# Patient Record
Sex: Male | Born: 1965 | Race: Black or African American | Hispanic: No | Marital: Single | Smoking: Current every day smoker
Health system: Southern US, Community
[De-identification: ages and names within clinical notes are randomized; demographics above are authoritative.]

## PROBLEM LIST (undated history)

## (undated) DIAGNOSIS — M1990 Unspecified osteoarthritis, unspecified site: Secondary | ICD-10-CM

## (undated) DIAGNOSIS — M797 Fibromyalgia: Secondary | ICD-10-CM

---

## 1997-11-08 ENCOUNTER — Emergency Department (HOSPITAL_COMMUNITY): Admission: EM | Admit: 1997-11-08 | Discharge: 1997-11-08 | Payer: Self-pay | Admitting: Emergency Medicine

## 1997-12-14 ENCOUNTER — Emergency Department (HOSPITAL_COMMUNITY): Admission: EM | Admit: 1997-12-14 | Discharge: 1997-12-14 | Payer: Self-pay | Admitting: Emergency Medicine

## 1999-02-14 ENCOUNTER — Encounter: Payer: Self-pay | Admitting: Emergency Medicine

## 1999-02-14 ENCOUNTER — Emergency Department (HOSPITAL_COMMUNITY): Admission: EM | Admit: 1999-02-14 | Discharge: 1999-02-14 | Payer: Self-pay | Admitting: Emergency Medicine

## 1999-03-03 ENCOUNTER — Encounter: Payer: Self-pay | Admitting: Emergency Medicine

## 1999-03-03 ENCOUNTER — Inpatient Hospital Stay (HOSPITAL_COMMUNITY): Admission: EM | Admit: 1999-03-03 | Discharge: 1999-03-07 | Payer: Self-pay | Admitting: Emergency Medicine

## 1999-06-29 ENCOUNTER — Emergency Department (HOSPITAL_COMMUNITY): Admission: EM | Admit: 1999-06-29 | Discharge: 1999-06-29 | Payer: Self-pay | Admitting: *Deleted

## 1999-08-15 ENCOUNTER — Emergency Department (HOSPITAL_COMMUNITY): Admission: EM | Admit: 1999-08-15 | Discharge: 1999-08-15 | Payer: Self-pay | Admitting: Emergency Medicine

## 1999-12-27 ENCOUNTER — Emergency Department (HOSPITAL_COMMUNITY): Admission: EM | Admit: 1999-12-27 | Discharge: 1999-12-27 | Payer: Self-pay | Admitting: Emergency Medicine

## 2001-04-21 ENCOUNTER — Emergency Department (HOSPITAL_COMMUNITY): Admission: EM | Admit: 2001-04-21 | Discharge: 2001-04-21 | Payer: Self-pay | Admitting: Emergency Medicine

## 2002-02-23 ENCOUNTER — Encounter: Payer: Self-pay | Admitting: Emergency Medicine

## 2002-02-23 ENCOUNTER — Emergency Department (HOSPITAL_COMMUNITY): Admission: EM | Admit: 2002-02-23 | Discharge: 2002-02-23 | Payer: Self-pay | Admitting: Emergency Medicine

## 2004-12-06 ENCOUNTER — Emergency Department (HOSPITAL_COMMUNITY): Admission: EM | Admit: 2004-12-06 | Discharge: 2004-12-06 | Payer: Self-pay | Admitting: Emergency Medicine

## 2005-04-14 ENCOUNTER — Emergency Department (HOSPITAL_COMMUNITY): Admission: EM | Admit: 2005-04-14 | Discharge: 2005-04-14 | Payer: Self-pay | Admitting: Emergency Medicine

## 2006-05-19 ENCOUNTER — Emergency Department (HOSPITAL_COMMUNITY): Admission: EM | Admit: 2006-05-19 | Discharge: 2006-05-19 | Payer: Self-pay | Admitting: Emergency Medicine

## 2007-11-27 ENCOUNTER — Emergency Department (HOSPITAL_COMMUNITY): Admission: EM | Admit: 2007-11-27 | Discharge: 2007-11-27 | Payer: Self-pay | Admitting: Emergency Medicine

## 2008-02-08 ENCOUNTER — Emergency Department (HOSPITAL_COMMUNITY): Admission: EM | Admit: 2008-02-08 | Discharge: 2008-02-08 | Payer: Self-pay | Admitting: Emergency Medicine

## 2010-02-02 ENCOUNTER — Emergency Department (HOSPITAL_COMMUNITY): Admission: EM | Admit: 2010-02-02 | Discharge: 2010-02-02 | Payer: Self-pay | Admitting: Emergency Medicine

## 2010-02-03 ENCOUNTER — Inpatient Hospital Stay (HOSPITAL_COMMUNITY): Admission: RE | Admit: 2010-02-03 | Discharge: 2010-02-08 | Payer: Self-pay | Admitting: Psychiatry

## 2010-02-03 ENCOUNTER — Ambulatory Visit: Payer: Self-pay | Admitting: Psychiatry

## 2010-06-24 ENCOUNTER — Emergency Department (HOSPITAL_COMMUNITY)
Admission: EM | Admit: 2010-06-24 | Discharge: 2010-06-25 | Disposition: A | Discharge status: Home / Self Care | Payer: Self-pay | Attending: Emergency Medicine | Admitting: Emergency Medicine

## 2010-06-24 DIAGNOSIS — F3289 Other specified depressive episodes: Secondary | ICD-10-CM | POA: Insufficient documentation

## 2010-06-24 DIAGNOSIS — F1910 Other psychoactive substance abuse, uncomplicated: Secondary | ICD-10-CM | POA: Insufficient documentation

## 2010-06-24 DIAGNOSIS — F329 Major depressive disorder, single episode, unspecified: Secondary | ICD-10-CM | POA: Insufficient documentation

## 2010-06-24 LAB — CBC
HCT: 41.5 % (ref 39.0–52.0)
Hemoglobin: 14 g/dL (ref 13.0–17.0)
MCH: 31 pg (ref 26.0–34.0)
MCHC: 33.7 g/dL (ref 30.0–36.0)
MCV: 91.8 fL (ref 78.0–100.0)
Platelets: 262 10*3/uL (ref 150–400)
RBC: 4.52 MIL/uL (ref 4.22–5.81)
RDW: 14.6 % (ref 11.5–15.5)
WBC: 8.6 10*3/uL (ref 4.0–10.5)

## 2010-06-24 LAB — DIFFERENTIAL
Basophils Absolute: 0 10*3/uL (ref 0.0–0.1)
Basophils Relative: 0 % (ref 0–1)
Eosinophils Absolute: 0.2 10*3/uL (ref 0.0–0.7)
Eosinophils Relative: 2 % (ref 0–5)
Lymphocytes Relative: 23 % (ref 12–46)
Lymphs Abs: 2 10*3/uL (ref 0.7–4.0)
Monocytes Absolute: 0.4 10*3/uL (ref 0.1–1.0)
Monocytes Relative: 5 % (ref 3–12)
Neutro Abs: 6 10*3/uL (ref 1.7–7.7)
Neutrophils Relative %: 70 % (ref 43–77)

## 2010-06-24 LAB — RAPID URINE DRUG SCREEN, HOSP PERFORMED
Amphetamines: NOT DETECTED
Benzodiazepines: NOT DETECTED

## 2010-06-24 LAB — COMPREHENSIVE METABOLIC PANEL
ALT: 30 U/L (ref 0–53)
AST: 59 U/L — ABNORMAL HIGH (ref 0–37)
CO2: 26 mEq/L (ref 19–32)
Calcium: 9.3 mg/dL (ref 8.4–10.5)
GFR calc Af Amer: >60 mL/min (ref >60)
GFR calc non Af Amer: >60 mL/min (ref >60)
Potassium: 4 mEq/L (ref 3.5–5.1)
Sodium: 139 mEq/L (ref 135–145)

## 2010-06-25 LAB — BASIC METABOLIC PANEL
CO2: 28 mEq/L (ref 19–32)
Chloride: 103 mEq/L (ref 96–112)
GFR calc Af Amer: >60 mL/min (ref >60)
Potassium: 4.2 mEq/L (ref 3.5–5.1)
Sodium: 137 mEq/L (ref 135–145)

## 2010-06-25 LAB — DIFFERENTIAL
Basophils Absolute: 0 10*3/uL (ref 0.0–0.1)
Basophils Relative: 1 % (ref 0–1)
Monocytes Relative: 5 % (ref 3–12)
Neutro Abs: 6.2 10*3/uL (ref 1.7–7.7)
Neutrophils Relative %: 72 % (ref 43–77)

## 2010-06-25 LAB — CBC
Hemoglobin: 13.4 g/dL (ref 13.0–17.0)
RBC: 4.37 MIL/uL (ref 4.22–5.81)

## 2010-06-28 LAB — RAPID URINE DRUG SCREEN, HOSP PERFORMED
Amphetamines: NOT DETECTED
Cocaine: POSITIVE — AB
Opiates: NOT DETECTED
Tetrahydrocannabinol: NOT DETECTED

## 2010-06-28 LAB — DIFFERENTIAL
Basophils Absolute: 0 10*3/uL (ref 0.0–0.1)
Basophils Relative: 0 % (ref 0–1)
Eosinophils Absolute: 0.2 10*3/uL (ref 0.0–0.7)
Monocytes Absolute: 0.3 10*3/uL (ref 0.1–1.0)
Neutro Abs: 6 10*3/uL (ref 1.7–7.7)

## 2010-06-28 LAB — BASIC METABOLIC PANEL
BUN: 9 mg/dL (ref 6–23)
Calcium: 9.3 mg/dL (ref 8.4–10.5)
GFR calc non Af Amer: >60 mL/min (ref >60)
Glucose, Bld: 146 mg/dL — ABNORMAL HIGH (ref 70–99)

## 2010-06-28 LAB — HEPATIC FUNCTION PANEL
AST: 32 U/L (ref 0–37)
Bilirubin, Direct: <0.1 mg/dL (ref 0.0–0.3)

## 2010-06-28 LAB — CBC
HCT: 41.7 % (ref 39.0–52.0)
MCH: 30.4 pg (ref 26.0–34.0)
MCHC: 34.3 g/dL (ref 30.0–36.0)
RDW: 15.3 % (ref 11.5–15.5)

## 2010-06-28 LAB — TSH: TSH: 0.893 u[IU]/mL (ref 0.350–4.500)

## 2010-06-28 LAB — VITAMIN B12: Vitamin B-12: 535 pg/mL (ref 211–911)

## 2010-06-28 LAB — FOLATE RBC: RBC Folate: 543 ng/mL (ref 180–600)

## 2010-12-01 ENCOUNTER — Emergency Department (HOSPITAL_COMMUNITY)
Admission: EM | Admit: 2010-12-01 | Discharge: 2010-12-02 | Disposition: A | Discharge status: Home / Self Care | Payer: Self-pay | Attending: Emergency Medicine | Admitting: Emergency Medicine

## 2010-12-01 DIAGNOSIS — F1010 Alcohol abuse, uncomplicated: Secondary | ICD-10-CM | POA: Insufficient documentation

## 2010-12-01 DIAGNOSIS — F1410 Cocaine abuse, uncomplicated: Secondary | ICD-10-CM | POA: Insufficient documentation

## 2010-12-01 DIAGNOSIS — F29 Unspecified psychosis not due to a substance or known physiological condition: Secondary | ICD-10-CM | POA: Insufficient documentation

## 2010-12-02 LAB — RAPID URINE DRUG SCREEN, HOSP PERFORMED
Barbiturates: NOT DETECTED
Benzodiazepines: NOT DETECTED

## 2010-12-02 LAB — ETHANOL: Alcohol, Ethyl (B): 191 mg/dL — ABNORMAL HIGH (ref 0–11)

## 2011-01-12 LAB — DIFFERENTIAL
Basophils Absolute: 0
Eosinophils Absolute: 0.1
Eosinophils Relative: 2
Monocytes Absolute: 0.2

## 2011-01-12 LAB — CBC
HCT: 38.5 — ABNORMAL LOW
Hemoglobin: 13.4
MCHC: 34.9
MCV: 92.8
Platelets: 235
RDW: 14.1

## 2011-01-12 LAB — RAPID URINE DRUG SCREEN, HOSP PERFORMED
Barbiturates: NOT DETECTED
Benzodiazepines: NOT DETECTED
Cocaine: POSITIVE — AB
Opiates: NOT DETECTED

## 2011-01-12 LAB — POCT I-STAT, CHEM 8
BUN: 12
Calcium, Ion: 1.12
Hemoglobin: 13.6
Sodium: 140
TCO2: 23

## 2011-01-16 LAB — CBC
MCHC: 34.2
MCV: 92.7
Platelets: 257
RDW: 14.2

## 2011-01-16 LAB — BASIC METABOLIC PANEL
CO2: 24
Calcium: 8.8
GFR calc Af Amer: >60
GFR calc non Af Amer: >60
Glucose, Bld: 68 — ABNORMAL LOW
Potassium: 4.2
Sodium: 138

## 2011-01-16 LAB — DIFFERENTIAL
Basophils Absolute: 0
Basophils Relative: 0
Eosinophils Absolute: 0.1
Monocytes Relative: 2 — ABNORMAL LOW
Neutro Abs: 6.7
Neutrophils Relative %: 77

## 2011-01-16 LAB — HEPATIC FUNCTION PANEL
Alkaline Phosphatase: 73
Total Protein: 7.1

## 2011-01-16 LAB — RAPID URINE DRUG SCREEN, HOSP PERFORMED
Amphetamines: NOT DETECTED
Barbiturates: NOT DETECTED
Opiates: NOT DETECTED
Tetrahydrocannabinol: NOT DETECTED

## 2011-01-16 LAB — SALICYLATE LEVEL: Salicylate Lvl: <4

## 2011-01-16 LAB — ACETAMINOPHEN LEVEL: Acetaminophen (Tylenol), Serum: <10 — ABNORMAL LOW

## 2014-11-26 DIAGNOSIS — M1990 Unspecified osteoarthritis, unspecified site: Secondary | ICD-10-CM | POA: Insufficient documentation

## 2014-11-26 DIAGNOSIS — Z791 Long term (current) use of non-steroidal anti-inflammatories (NSAID): Secondary | ICD-10-CM | POA: Insufficient documentation

## 2014-11-26 DIAGNOSIS — F329 Major depressive disorder, single episode, unspecified: Secondary | ICD-10-CM | POA: Insufficient documentation

## 2014-11-26 DIAGNOSIS — F10120 Alcohol abuse with intoxication, uncomplicated: Secondary | ICD-10-CM | POA: Insufficient documentation

## 2014-11-26 DIAGNOSIS — Z720 Tobacco use: Secondary | ICD-10-CM | POA: Insufficient documentation

## 2014-11-27 ENCOUNTER — Encounter (HOSPITAL_COMMUNITY): Payer: Self-pay | Admitting: Emergency Medicine

## 2014-11-27 ENCOUNTER — Emergency Department (HOSPITAL_COMMUNITY)
Admission: EM | Admit: 2014-11-27 | Discharge: 2014-11-27 | Disposition: A | Discharge status: Other Acute Inpatient Hospital | Payer: Self-pay | Attending: Emergency Medicine | Admitting: Emergency Medicine

## 2014-11-27 ENCOUNTER — Encounter (HOSPITAL_COMMUNITY): Payer: Self-pay | Admitting: *Deleted

## 2014-11-27 ENCOUNTER — Inpatient Hospital Stay (HOSPITAL_COMMUNITY)
Admission: AD | Admit: 2014-11-27 | Discharge: 2014-12-05 | DRG: 897 | Disposition: A | Discharge status: Other Acute Inpatient Hospital | Payer: Federal, State, Local not specified - Other | Source: Intra-hospital | Attending: Psychiatry | Admitting: Psychiatry

## 2014-11-27 DIAGNOSIS — F10920 Alcohol use, unspecified with intoxication, uncomplicated: Secondary | ICD-10-CM

## 2014-11-27 DIAGNOSIS — M797 Fibromyalgia: Secondary | ICD-10-CM | POA: Diagnosis present

## 2014-11-27 DIAGNOSIS — F1410 Cocaine abuse, uncomplicated: Secondary | ICD-10-CM | POA: Diagnosis not present

## 2014-11-27 DIAGNOSIS — R292 Abnormal reflex: Secondary | ICD-10-CM | POA: Diagnosis present

## 2014-11-27 DIAGNOSIS — M4712 Other spondylosis with myelopathy, cervical region: Secondary | ICD-10-CM | POA: Diagnosis present

## 2014-11-27 DIAGNOSIS — G4700 Insomnia, unspecified: Secondary | ICD-10-CM | POA: Diagnosis present

## 2014-11-27 DIAGNOSIS — M1990 Unspecified osteoarthritis, unspecified site: Secondary | ICD-10-CM | POA: Diagnosis present

## 2014-11-27 DIAGNOSIS — F1994 Other psychoactive substance use, unspecified with psychoactive substance-induced mood disorder: Secondary | ICD-10-CM | POA: Diagnosis present

## 2014-11-27 DIAGNOSIS — F329 Major depressive disorder, single episode, unspecified: Secondary | ICD-10-CM | POA: Diagnosis present

## 2014-11-27 DIAGNOSIS — F1020 Alcohol dependence, uncomplicated: Secondary | ICD-10-CM | POA: Diagnosis not present

## 2014-11-27 DIAGNOSIS — Z590 Homelessness: Secondary | ICD-10-CM

## 2014-11-27 DIAGNOSIS — M4802 Spinal stenosis, cervical region: Secondary | ICD-10-CM | POA: Diagnosis present

## 2014-11-27 DIAGNOSIS — R2681 Unsteadiness on feet: Secondary | ICD-10-CM

## 2014-11-27 DIAGNOSIS — G9589 Other specified diseases of spinal cord: Secondary | ICD-10-CM | POA: Diagnosis present

## 2014-11-27 DIAGNOSIS — F32A Depression, unspecified: Secondary | ICD-10-CM

## 2014-11-27 DIAGNOSIS — F17210 Nicotine dependence, cigarettes, uncomplicated: Secondary | ICD-10-CM | POA: Diagnosis present

## 2014-11-27 DIAGNOSIS — Z87828 Personal history of other (healed) physical injury and trauma: Secondary | ICD-10-CM

## 2014-11-27 DIAGNOSIS — F1420 Cocaine dependence, uncomplicated: Secondary | ICD-10-CM | POA: Diagnosis not present

## 2014-11-27 HISTORY — DX: Fibromyalgia: M79.7

## 2014-11-27 HISTORY — DX: Unspecified osteoarthritis, unspecified site: M19.90

## 2014-11-27 LAB — RAPID URINE DRUG SCREEN, HOSP PERFORMED
Amphetamines: NOT DETECTED
BENZODIAZEPINES: NOT DETECTED
Barbiturates: NOT DETECTED
COCAINE: NOT DETECTED
OPIATES: NOT DETECTED
TETRAHYDROCANNABINOL: NOT DETECTED

## 2014-11-27 LAB — CBC
HCT: 39.6 % (ref 39.0–52.0)
HEMOGLOBIN: 13.7 g/dL (ref 13.0–17.0)
MCH: 31.4 pg (ref 26.0–34.0)
MCHC: 34.6 g/dL (ref 30.0–36.0)
MCV: 90.8 fL (ref 78.0–100.0)
Platelets: 239 10*3/uL (ref 150–400)
RBC: 4.36 MIL/uL (ref 4.22–5.81)
RDW: 13.3 % (ref 11.5–15.5)
WBC: 9.2 10*3/uL (ref 4.0–10.5)

## 2014-11-27 LAB — COMPREHENSIVE METABOLIC PANEL
ALK PHOS: 56 U/L (ref 38–126)
ALT: 26 U/L (ref 17–63)
AST: 41 U/L (ref 15–41)
Albumin: 4.3 g/dL (ref 3.5–5.0)
Anion gap: 10 (ref 5–15)
BILIRUBIN TOTAL: 0.4 mg/dL (ref 0.3–1.2)
BUN: 6 mg/dL (ref 6–20)
CO2: 25 mmol/L (ref 22–32)
Calcium: 9.2 mg/dL (ref 8.9–10.3)
Chloride: 102 mmol/L (ref 101–111)
Creatinine, Ser: 0.76 mg/dL (ref 0.61–1.24)
GLUCOSE: 88 mg/dL (ref 65–99)
POTASSIUM: 3.7 mmol/L (ref 3.5–5.1)
Sodium: 137 mmol/L (ref 135–145)
Total Protein: 7.8 g/dL (ref 6.5–8.1)

## 2014-11-27 LAB — ETHANOL: Alcohol, Ethyl (B): 131 mg/dL — ABNORMAL HIGH (ref <5)

## 2014-11-27 LAB — ACETAMINOPHEN LEVEL: Acetaminophen (Tylenol), Serum: <10 ug/mL — ABNORMAL LOW (ref 10–30)

## 2014-11-27 LAB — SALICYLATE LEVEL: Salicylate Lvl: <4 mg/dL (ref 2.8–30.0)

## 2014-11-27 MED ORDER — ADULT MULTIVITAMIN W/MINERALS CH: 1.0000 | ORAL_TABLET | Freq: Every day | ORAL | Status: DC

## 2014-11-27 MED ORDER — PANTOPRAZOLE SODIUM 20 MG PO TBEC
20.0000 mg | DELAYED_RELEASE_TABLET | Freq: Every day | ORAL | Status: DC
  Administered 2014-11-27 – 2014-12-05 (×9): 20 mg via ORAL
  Filled 2014-11-27 (×12): qty 1

## 2014-11-27 MED ORDER — LORAZEPAM 1 MG PO TABS
1.0000 mg | ORAL_TABLET | Freq: Four times a day (QID) | ORAL | Status: AC
  Administered 2014-11-27 – 2014-11-28 (×3): 1 mg via ORAL
  Filled 2014-11-27 (×3): qty 1

## 2014-11-27 MED ORDER — HYDROXYZINE HCL 25 MG PO TABS: 25.0000 mg | ORAL_TABLET | Freq: Four times a day (QID) | ORAL | Status: DC | PRN

## 2014-11-27 MED ORDER — THIAMINE HCL 100 MG/ML IJ SOLN
100.0000 mg | Freq: Once | INTRAMUSCULAR | Status: AC
  Administered 2014-11-27: 100 mg via INTRAMUSCULAR
  Filled 2014-11-27: qty 2

## 2014-11-27 MED ORDER — TRAZODONE HCL 50 MG PO TABS
50.0000 mg | ORAL_TABLET | Freq: Every evening | ORAL | Status: DC | PRN
  Filled 2014-11-27 (×3): qty 1

## 2014-11-27 MED ORDER — LORAZEPAM 1 MG PO TABS: 0.0000 mg | ORAL_TABLET | Freq: Two times a day (BID) | ORAL | Status: DC

## 2014-11-27 MED ORDER — LOPERAMIDE HCL 2 MG PO CAPS: 2.0000 mg | ORAL_CAPSULE | ORAL | Status: DC | PRN

## 2014-11-27 MED ORDER — THIAMINE HCL 100 MG/ML IJ SOLN: 100.0000 mg | Freq: Once | INTRAMUSCULAR | Status: DC

## 2014-11-27 MED ORDER — ONDANSETRON 4 MG PO TBDP: 4.0000 mg | ORAL_TABLET | Freq: Four times a day (QID) | ORAL | Status: AC | PRN

## 2014-11-27 MED ORDER — NICOTINE 21 MG/24HR TD PT24: 21.0000 mg | MEDICATED_PATCH | Freq: Every day | TRANSDERMAL | Status: DC

## 2014-11-27 MED ORDER — IBUPROFEN 200 MG PO TABS: 600.0000 mg | ORAL_TABLET | Freq: Three times a day (TID) | ORAL | Status: DC | PRN

## 2014-11-27 MED ORDER — LORAZEPAM 1 MG PO TABS: 0.0000 mg | ORAL_TABLET | Freq: Four times a day (QID) | ORAL | Status: DC

## 2014-11-27 MED ORDER — PNEUMOCOCCAL VAC POLYVALENT 25 MCG/0.5ML IJ INJ: 0.5000 mL | INJECTION | INTRAMUSCULAR | Status: DC

## 2014-11-27 MED ORDER — ADULT MULTIVITAMIN W/MINERALS CH
1.0000 | ORAL_TABLET | Freq: Every day | ORAL | Status: DC
  Administered 2014-11-27 – 2014-12-05 (×9): 1 via ORAL
  Filled 2014-11-27 (×11): qty 1

## 2014-11-27 MED ORDER — NICOTINE POLACRILEX 2 MG MT GUM: 2.0000 mg | CHEWING_GUM | OROMUCOSAL | Status: DC | PRN

## 2014-11-27 MED ORDER — LORAZEPAM 1 MG PO TABS
1.0000 mg | ORAL_TABLET | Freq: Two times a day (BID) | ORAL | Status: AC
  Administered 2014-11-29 – 2014-11-30 (×2): 1 mg via ORAL
  Filled 2014-11-27 (×2): qty 1

## 2014-11-27 MED ORDER — ONDANSETRON 4 MG PO TBDP: 4.0000 mg | ORAL_TABLET | Freq: Four times a day (QID) | ORAL | Status: DC | PRN

## 2014-11-27 MED ORDER — MAGNESIUM HYDROXIDE 400 MG/5ML PO SUSP: 30.0000 mL | Freq: Every day | ORAL | Status: DC | PRN

## 2014-11-27 MED ORDER — LORAZEPAM 1 MG PO TABS
1.0000 mg | ORAL_TABLET | Freq: Every day | ORAL | Status: AC
  Administered 2014-12-01: 1 mg via ORAL
  Filled 2014-11-27: qty 1

## 2014-11-27 MED ORDER — VITAMIN B-1 100 MG PO TABS: 100.0000 mg | ORAL_TABLET | Freq: Every day | ORAL | Status: DC

## 2014-11-27 MED ORDER — ACETAMINOPHEN 325 MG PO TABS: 650.0000 mg | ORAL_TABLET | Freq: Four times a day (QID) | ORAL | Status: DC | PRN

## 2014-11-27 MED ORDER — ALUM & MAG HYDROXIDE-SIMETH 200-200-20 MG/5ML PO SUSP: 30.0000 mL | ORAL | Status: DC | PRN

## 2014-11-27 MED ORDER — LORAZEPAM 1 MG PO TABS
1.0000 mg | ORAL_TABLET | Freq: Three times a day (TID) | ORAL | Status: AC
  Administered 2014-11-28 – 2014-11-29 (×3): 1 mg via ORAL
  Filled 2014-11-27 (×3): qty 1

## 2014-11-27 MED ORDER — FLUOXETINE HCL 20 MG PO CAPS
20.0000 mg | ORAL_CAPSULE | Freq: Every day | ORAL | Status: DC
  Administered 2014-11-27 – 2014-12-03 (×7): 20 mg via ORAL
  Filled 2014-11-27 (×10): qty 1

## 2014-11-27 MED ORDER — LOPERAMIDE HCL 2 MG PO CAPS: 2.0000 mg | ORAL_CAPSULE | ORAL | Status: AC | PRN

## 2014-11-27 MED ORDER — LORAZEPAM 1 MG PO TABS: 1.0000 mg | ORAL_TABLET | Freq: Four times a day (QID) | ORAL | Status: AC | PRN

## 2014-11-27 MED ORDER — MELOXICAM 15 MG PO TABS
15.0000 mg | ORAL_TABLET | Freq: Every day | ORAL | Status: DC
  Administered 2014-11-27 – 2014-12-05 (×9): 15 mg via ORAL
  Filled 2014-11-27 (×11): qty 1

## 2014-11-27 MED ORDER — ENSURE ENLIVE PO LIQD
237.0000 mL | Freq: Two times a day (BID) | ORAL | Status: DC
  Administered 2014-11-27 – 2014-12-05 (×16): 237 mL via ORAL

## 2014-11-27 MED ORDER — HYDROXYZINE HCL 25 MG PO TABS: 25.0000 mg | ORAL_TABLET | Freq: Three times a day (TID) | ORAL | Status: DC | PRN

## 2014-11-27 MED ORDER — VITAMIN B-1 100 MG PO TABS
100.0000 mg | ORAL_TABLET | Freq: Every day | ORAL | Status: DC
  Administered 2014-11-28 – 2014-12-05 (×8): 100 mg via ORAL
  Filled 2014-11-27 (×10): qty 1

## 2014-11-27 MED ORDER — HYDROXYZINE HCL 25 MG PO TABS
25.0000 mg | ORAL_TABLET | Freq: Four times a day (QID) | ORAL | Status: AC | PRN
  Filled 2014-11-27: qty 1

## 2014-11-27 MED ORDER — LORAZEPAM 1 MG PO TABS: 1.0000 mg | ORAL_TABLET | Freq: Four times a day (QID) | ORAL | Status: DC | PRN

## 2014-11-27 MED ORDER — ZOLPIDEM TARTRATE 5 MG PO TABS: 5.0000 mg | ORAL_TABLET | Freq: Every evening | ORAL | Status: DC | PRN

## 2014-11-27 NOTE — ED Notes (Signed)
This RN called [facility] for transport. 

## 2014-11-27 NOTE — Progress Notes (Signed)
Patient ID: Darren Graham, male   DOB: 12/25/1965, 49 y.o.   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  PT consult has been ordered.  Patient may use walker to ambulate per MD.  Patient has been placed on the ativan protocol.  He reports passive SI with no specific plan.  He states he is tired of his drinking and wants help.  He presents with flat, blunted affect with depressed mood.  Patient has been up in the milieu interacting with others. A: Continue to monitor medication management and MD orders.  Safety checks completed every 15 minutes per protocol.  Offer encouragement and support as needed. R: Patient's behavior is appropriate to situation.
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
## 2014-11-27 NOTE — [facility] Group Notes (Signed)
[facility] Group Notes:  (Nursing/MHT/Case Management/Adjunct)  Date:  11/27/2014  Time:  0900 am  Type of Therapy:  Psychoeducational Skills  Participation Level:  Did Not Attend   Patient invited; declined to attend.  [place] Mon 11/27/2014, 10:50 AM

## 2014-11-27 NOTE — ED Notes (Signed)
Ford with BH called to report patient has a bed at [facility]. Bed 307 bed 2. MD Dub Mikes is accepting.

## 2014-11-27 NOTE — H&P (Addendum)
Psychiatric Admission Assessment Adult  Patient Identification: Darren Graham MRN:  [id number] Date of Evaluation:  11/27/2014 Chief Complaint:  " I am drinking myself to death" Principal Diagnosis:Alcohol Dependence , Cocaine Abuse / Substance induced mood disorder Diagnosis:   Patient Active Problem List   Diagnosis Date Noted  . Substance induced mood disorder [F19.94] 11/27/2014  . Alcohol use disorder, severe, dependence [F10.20] 11/27/2014  . Cocaine use disorder, moderate, dependence [F14.20] 11/27/2014   History of Present Illness:: 49 year old male . Long history of alcohol dependence, states he has been drinking up to 400 oz of beer per day. " I am basically drinking all day, from when I get up to when I fall asleep". He has been drinking daily for " at least a few months now".  States " I have been getting sick from drinking, I've lost weight, I need to stop". Due to this he decided to seek treatment , detox.  Last drank 1-2 days ago. He has been using cocaine, but not regularly. At this time he is not presenting with any clear tremors, restlessness or diaphoresis, but states he is feeling " shaky inside ". States he has history of depression, which he attributes to his psychosocial stressors- homelessness, no steady source of income, and to alcohol. Has had some passive SI, with thoughts such as " what's the use of living if I keep on living like this ?" . Denies any current SI and contracts for safety on the unit.  Endorses symptoms of depression as below.   Elements: Severe , Chronic Alcohol Dependence. Also abusing Cocaine. In the context of his substance abuse and severe psychosocial stressors such as homelessness , has been feeling progressively more depressed and has had recent passive SI. Associated Signs/Symptoms: Depression Symptoms:  depressed mood, anhedonia, hopelessness, recurrent thoughts of death, loss of energy/fatigue, weight loss, decreased appetite, (Hypo)  Manic Symptoms: denies  Anxiety Symptoms:  States he is often anxious , denies agoraphobia  Psychotic Symptoms:   Denies  PTSD Symptoms: Denies  Total Time spent with patient: 45 minutes   Past Psychiatric History:  History of depression, has been on Prozac in the past, denies having had side effects. Denies history of mania, denies history of psychosis, denies history of PTSD. States that alcohol contributes to his depression. Denies any history of violence .  Has not been on any psychiatric medications for several years .   Past Medical History: denies medical history other than as below . Past Medical History  Diagnosis Date  . Arthritis   . Fibromyalgia    History reviewed. No pertinent past surgical history. Family History:  Father alive, mother deceased, has two brothers and two sisters.  Denies history of mental illness in family, denies any history of suicides in family. Both parents alcoholic .  Social History:   Single, states he does not  Have children. Homeless, unemployed, no source of income.  Denies legal issues . History  Alcohol Use  . Yes    Comment: 8-10 40 ounces daily     History  Drug Use  . Yes  . Special: Cocaine    Comment: $50-$60 sporadic    Social History   Social History  . Marital Status: Single    Spouse Name: N/A  . Number of Children: N/A  . Years of Education: N/A   Social History Main Topics  . Smoking status: Current Every Day Smoker -- 0.50 packs/day for 35 years    Types: Cigarettes  .  Smokeless tobacco: None  . Alcohol Use: Yes     Comment: 8-10 40 ounces daily  . Drug Use: Yes    Special: Cocaine     Comment: $50-$60 sporadic  . Sexual Activity: Yes    Birth Control/ Protection: Condom   Other Topics Concern  . None   Social History Narrative   Additional Social History:    History of alcohol / drug use?: Yes Longest period of sobriety (when/how long): 2 1/2 years Negative Consequences of Use: Museum/gallery curator, Scientist, research (physical sciences), Personal  relationships, Work / School Withdrawal Symptoms: Nausea / Vomiting, Tremors Name of Substance 1: etoch 1 - Age of First Use: 12 1 - Amount (size/oz): 8-10 40 ounces  1 - Frequency: daily 1 - Duration: 17 years 1 - Last Use / Amount: 11/26/14 Name of Substance 2: cocaine 2 - Age of First Use: 23 2 - Amount (size/oz): $50-$60 2 - Frequency: sporadic 2 - Duration: 26 years 2 - Last Use / Amount: 11/24/14  Musculoskeletal: Strength & Muscle Tone: within normal limits Gait & Station: states he has frequent cramps on R leg which make ambulation difficult at times  Patient leans: N/A  Psychiatric Specialty Exam: Physical Exam  Review of Systems  Constitutional: Positive for weight loss.  HENT: Negative.   Eyes: Negative.   Respiratory: Negative.   Cardiovascular: Negative.   Gastrointestinal: Positive for nausea and vomiting. Negative for blood in stool.       Related to alcohol use    Genitourinary: Negative.   Musculoskeletal: Negative.        States R leg " cramps up " , leading to unsteady gait at times    Skin: Negative.   Neurological: Negative.  Negative for seizures.  Psychiatric/Behavioral: Positive for depression and substance abuse.  all other systems negative   Blood pressure 124/70, pulse 97, temperature 97.6 F (36.4 C), temperature source Oral, resp. rate 18, height 5' 6"  (1.676 m), weight 153 lb (69.4 kg).Body mass index is 24.71 kg/(m^2).  General Appearance: Fairly Groomed  Engineer, water::  Good  Speech:  Normal Rate  Volume:  Normal  Mood:  Depressed  Affect:  Constricted, but reactive   Thought Process:  Linear  Orientation:  Other:  fully alert and attentive   Thought Content:  no hallucinations , no delusions   Suicidal Thoughts:  No  Homicidal Thoughts:  No  Memory:  recent and remote grossly intact   Judgement:  Fair  Insight:  Present  Psychomotor Activity:  Normal- no restlessness, no agitation at this time, no tremors   Concentration:  Good   Recall:  Good  Fund of Knowledge:Good  Language: Good  Akathisia:  Negative  Handed:  Right  AIMS (if indicated):     Assets:  Desire for Improvement Resilience  ADL's:  Fair   Cognition: WNL  Sleep:      Risk to Self: Is patient at risk for suicide?: Yes Risk to Others:   Prior Inpatient Therapy:   Prior Outpatient Therapy:    Alcohol Screening: 1. How often do you have a drink containing alcohol?: 4 or more times a week 2. How many drinks containing alcohol do you have on a typical day when you are drinking?: 7, 8, or 9 3. How often do you have six or more drinks on one occasion?: Daily or almost daily Preliminary Score: 7 4. How often during the last year have you found that you were not able to stop drinking once you had started?:  Daily or almost daily 5. How often during the last year have you failed to do what was normally expected from you becasue of drinking?: Weekly 6. How often during the last year have you needed a first drink in the morning to get yourself going after a heavy drinking session?: Daily or almost daily 7. How often during the last year have you had a feeling of guilt of remorse after drinking?: Daily or almost daily 8. How often during the last year have you been unable to remember what happened the night before because you had been drinking?: Weekly 9. Have you or someone else been injured as a result of your drinking?: Yes, but not in the last year 10. Has a relative or friend or a doctor or another health worker been concerned about your drinking or suggested you cut down?: Yes, during the last year Alcohol Use Disorder Identification Test Final Score (AUDIT): 35  Allergies:  No Known Allergies Lab Results:  Results for orders placed or performed during the hospital encounter of 11/27/14 (from the past 48 hour(s))  Comprehensive metabolic panel     Status: None   Collection Time: 11/27/14  1:04 AM  Result Value Ref Range   Sodium 137 135 - 145 mmol/L    Potassium 3.7 3.5 - 5.1 mmol/L   Chloride 102 101 - 111 mmol/L   CO2 25 22 - 32 mmol/L   Glucose, Bld 88 65 - 99 mg/dL   BUN 6 6 - 20 mg/dL   Creatinine, Ser 0.76 0.61 - 1.24 mg/dL   Calcium 9.2 8.9 - 10.3 mg/dL   Total Protein 7.8 6.5 - 8.1 g/dL   Albumin 4.3 3.5 - 5.0 g/dL   AST 41 15 - 41 U/L   ALT 26 17 - 63 U/L   Alkaline Phosphatase 56 38 - 126 U/L   Total Bilirubin 0.4 0.3 - 1.2 mg/dL   GFR calc non Af Amer >60 >60 mL/min   GFR calc Af Amer >60 >60 mL/min    Comment: (NOTE) The eGFR has been calculated using the CKD EPI equation. This calculation has not been validated in all clinical situations. eGFR's persistently <60 mL/min signify possible Chronic Kidney Disease.    Anion gap 10 5 - 15  Ethanol (ETOH)     Status: Abnormal   Collection Time: 11/27/14  1:04 AM  Result Value Ref Range   Alcohol, Ethyl (B) 131 (H) <5 mg/dL    Comment:        LOWEST DETECTABLE LIMIT FOR SERUM ALCOHOL IS 5 mg/dL FOR MEDICAL PURPOSES ONLY   Urine rapid drug screen (hosp performed) (Not at [facility])     Status: None   Collection Time: 11/27/14  1:04 AM  Result Value Ref Range   Opiates NONE DETECTED NONE DETECTED   Cocaine NONE DETECTED NONE DETECTED   Benzodiazepines NONE DETECTED NONE DETECTED   Amphetamines NONE DETECTED NONE DETECTED   Tetrahydrocannabinol NONE DETECTED NONE DETECTED   Barbiturates NONE DETECTED NONE DETECTED    Comment:        DRUG SCREEN FOR MEDICAL PURPOSES ONLY.  IF CONFIRMATION IS NEEDED FOR ANY PURPOSE, NOTIFY LAB WITHIN 5 DAYS.        LOWEST DETECTABLE LIMITS FOR URINE DRUG SCREEN Drug Class       Cutoff (ng/mL) Amphetamine      1000 Barbiturate      200 Benzodiazepine   915 Tricyclics       056 Opiates  300 Cocaine          300 THC              50   CBC     Status: None   Collection Time: 11/27/14  1:04 AM  Result Value Ref Range   WBC 9.2 4.0 - 10.5 K/uL   RBC 4.36 4.22 - 5.81 MIL/uL   Hemoglobin 13.7 13.0 - 17.0 g/dL   HCT 39.6  39.0 - 52.0 %   MCV 90.8 78.0 - 100.0 fL   MCH 31.4 26.0 - 34.0 pg   MCHC 34.6 30.0 - 36.0 g/dL   RDW 13.3 11.5 - 15.5 %   Platelets 239 706 - 237 K/uL  Salicylate level     Status: None   Collection Time: 11/27/14  1:04 AM  Result Value Ref Range   Salicylate Lvl <6.2 2.8 - 30.0 mg/dL  Acetaminophen level     Status: Abnormal   Collection Time: 11/27/14  1:04 AM  Result Value Ref Range   Acetaminophen (Tylenol), Serum <10 (L) 10 - 30 ug/mL    Comment:        THERAPEUTIC CONCENTRATIONS VARY SIGNIFICANTLY. A RANGE OF 10-30 ug/mL MAY BE AN EFFECTIVE CONCENTRATION FOR MANY PATIENTS. HOWEVER, SOME ARE BEST TREATED AT CONCENTRATIONS OUTSIDE THIS RANGE. ACETAMINOPHEN CONCENTRATIONS >150 ug/mL AT 4 HOURS AFTER INGESTION AND >50 ug/mL AT 12 HOURS AFTER INGESTION ARE OFTEN ASSOCIATED WITH TOXIC REACTIONS.    Current Medications: Current Facility-Administered Medications  Medication Dose Route Frequency Provider Last Rate Last Dose  . acetaminophen (TYLENOL) tablet 650 mg  650 mg Oral Q6H PRN Harriet Butte, NP      . alum & mag hydroxide-simeth (MAALOX/MYLANTA) 200-200-20 MG/5ML suspension 30 mL  30 mL Oral Q4H PRN Harriet Butte, NP      . feeding supplement (ENSURE ENLIVE) (ENSURE ENLIVE) liquid 237 mL  237 mL Oral BID BM Nicholaus Bloom, MD   237 mL at 11/27/14 8315  . hydrOXYzine (ATARAX/VISTARIL) tablet 25 mg  25 mg Oral TID PRN Harriet Butte, NP      . magnesium hydroxide (MILK OF MAGNESIA) suspension 30 mL  30 mL Oral Daily PRN Harriet Butte, NP      . meloxicam (MOBIC) tablet 15 mg  15 mg Oral Daily Harriet Butte, NP   15 mg at 11/27/14 1761  . nicotine polacrilex (NICORETTE) gum 2 mg  2 mg Oral PRN Nicholaus Bloom, MD      . Derrill Memo ON 11/28/2014] pneumococcal 23 valent vaccine (PNU-IMMUNE) injection 0.5 mL  0.5 mL Intramuscular Tomorrow-1000 Nicholaus Bloom, MD      . traZODone (DESYREL) tablet 50 mg  50 mg Oral QHS PRN Harriet Butte, NP       PTA  Medications: Prescriptions prior to admission  Medication Sig Dispense Refill Last Dose  . acetaminophen (TYLENOL) 650 MG CR tablet Take 650 mg by mouth every 8 (eight) hours as needed for pain.   Past Week at Unknown time  . meloxicam (MOBIC) 15 MG tablet Take 15 mg by mouth daily.   Past Month at Unknown time    Previous Psychotropic Medications:  Prozac in the past, but not over several years. States " it worked " and does not remember having had side effects   Substance Abuse History in the last 12 months:  Yes.  - alcohol dependence, as above. Cocaine Abuse. Denies IVDA.    Consequences of Substance Abuse: Blackouts:  history of WDL symptoms, but denies history of WDL seizures   Results for orders placed or performed during the hospital encounter of 11/27/14 (from the past 72 hour(s))  Comprehensive metabolic panel     Status: None   Collection Time: 11/27/14  1:04 AM  Result Value Ref Range   Sodium 137 135 - 145 mmol/L   Potassium 3.7 3.5 - 5.1 mmol/L   Chloride 102 101 - 111 mmol/L   CO2 25 22 - 32 mmol/L   Glucose, Bld 88 65 - 99 mg/dL   BUN 6 6 - 20 mg/dL   Creatinine, Ser 0.76 0.61 - 1.24 mg/dL   Calcium 9.2 8.9 - 10.3 mg/dL   Total Protein 7.8 6.5 - 8.1 g/dL   Albumin 4.3 3.5 - 5.0 g/dL   AST 41 15 - 41 U/L   ALT 26 17 - 63 U/L   Alkaline Phosphatase 56 38 - 126 U/L   Total Bilirubin 0.4 0.3 - 1.2 mg/dL   GFR calc non Af Amer >60 >60 mL/min   GFR calc Af Amer >60 >60 mL/min    Comment: (NOTE) The eGFR has been calculated using the CKD EPI equation. This calculation has not been validated in all clinical situations. eGFR's persistently <60 mL/min signify possible Chronic Kidney Disease.    Anion gap 10 5 - 15  Ethanol (ETOH)     Status: Abnormal   Collection Time: 11/27/14  1:04 AM  Result Value Ref Range   Alcohol, Ethyl (B) 131 (H) <5 mg/dL    Comment:        LOWEST DETECTABLE LIMIT FOR SERUM ALCOHOL IS 5 mg/dL FOR MEDICAL PURPOSES ONLY   Urine rapid  drug screen (hosp performed) (Not at [facility])     Status: None   Collection Time: 11/27/14  1:04 AM  Result Value Ref Range   Opiates NONE DETECTED NONE DETECTED   Cocaine NONE DETECTED NONE DETECTED   Benzodiazepines NONE DETECTED NONE DETECTED   Amphetamines NONE DETECTED NONE DETECTED   Tetrahydrocannabinol NONE DETECTED NONE DETECTED   Barbiturates NONE DETECTED NONE DETECTED    Comment:        DRUG SCREEN FOR MEDICAL PURPOSES ONLY.  IF CONFIRMATION IS NEEDED FOR ANY PURPOSE, NOTIFY LAB WITHIN 5 DAYS.        LOWEST DETECTABLE LIMITS FOR URINE DRUG SCREEN Drug Class       Cutoff (ng/mL) Amphetamine      1000 Barbiturate      200 Benzodiazepine   497 Tricyclics       026 Opiates          300 Cocaine          300 THC              50   CBC     Status: None   Collection Time: 11/27/14  1:04 AM  Result Value Ref Range   WBC 9.2 4.0 - 10.5 K/uL   RBC 4.36 4.22 - 5.81 MIL/uL   Hemoglobin 13.7 13.0 - 17.0 g/dL   HCT 39.6 39.0 - 52.0 %   MCV 90.8 78.0 - 100.0 fL   MCH 31.4 26.0 - 34.0 pg   MCHC 34.6 30.0 - 36.0 g/dL   RDW 13.3 11.5 - 15.5 %   Platelets 239 378 - 588 K/uL  Salicylate level     Status: None   Collection Time: 11/27/14  1:04 AM  Result Value Ref Range   Salicylate Lvl <5.0 2.8 - 30.0  mg/dL  Acetaminophen level     Status: Abnormal   Collection Time: 11/27/14  1:04 AM  Result Value Ref Range   Acetaminophen (Tylenol), Serum <10 (L) 10 - 30 ug/mL    Comment:        THERAPEUTIC CONCENTRATIONS VARY SIGNIFICANTLY. A RANGE OF 10-30 ug/mL MAY BE AN EFFECTIVE CONCENTRATION FOR MANY PATIENTS. HOWEVER, SOME ARE BEST TREATED AT CONCENTRATIONS OUTSIDE THIS RANGE. ACETAMINOPHEN CONCENTRATIONS >150 ug/mL AT 4 HOURS AFTER INGESTION AND >50 ug/mL AT 12 HOURS AFTER INGESTION ARE OFTEN ASSOCIATED WITH TOXIC REACTIONS.     Observation Level/Precautions:  15 minute checks  Laboratory:  As needed   Psychotherapy:  Group, milieu  Medications:  Start Prozac 20 mgrs  QDAY for depression. Ativan Detox Protocol , takes Mobic for arthritic pain, will start Protonix to minimize GERD  symptoms  Consultations:  Consult PT due to R leg cramping leading to unsteady gait at times   Discharge Concerns:  Homelessness , limited social support   Estimated LOS: 5 days   Other:     Psychological Evaluations:  No   Treatment Plan Summary: Daily contact with patient to assess and evaluate symptoms and progress in treatment, Medication management, Plan inpatient admission and medications as above   Medical Decision Making:  Review of Psycho-Social Stressors (1), Review or order clinical lab tests (1), Established Problem, Worsening (2) and Review of Medication Regimen & Side Effects (2)  I certify that inpatient services furnished can reasonably be expected to improve the patient's condition.   COBOS, FERNANDO 8/13/201610:50 AM

## 2014-11-27 NOTE — ED Notes (Addendum)
Pt from home requsting detox from alcohol. Last drink was about 1 hour ago. Pt report SI by drinking self to death. Pt states "I want to stop but if I can't stop what's the use". Denies HI.

## 2014-11-27 NOTE — [facility] Suicide Risk Assessment (Signed)
[facility] Admission Suicide Risk Assessment   Nursing information obtained from:  Patient Demographic factors:   Homeless, unemployed  Current Mental Status:  See below  Loss Factors:  Homelessness, unemployment, limited support system Historical Factors:  Depression, alcohol dependence  Risk Reduction Factors: resilience, desire for improvement  Total Time spent with patient: 45 minutes Principal Problem: Alcohol Dependence  Substance induced mood disorder Diagnosis:   Patient Active Problem List   Diagnosis Date Noted  . Substance induced mood disorder [F19.94] 11/27/2014  . Alcohol use disorder, severe, dependence [F10.20] 11/27/2014  . Cocaine use disorder, moderate, dependence [F14.20] 11/27/2014     Continued Clinical Symptoms:  Alcohol Use Disorder Identification Test Final Score (AUDIT): 35 The "Alcohol Use Disorders Identification Test", Guidelines for Use in Primary Care, Second Edition.  World Science writer [facility]). Score between 0-7:  no or low risk or alcohol related problems. Score between 8-15:  moderate risk of alcohol related problems. Score between 16-19:  high risk of alcohol related problems. Score 20 or above:  warrants further diagnostic evaluation for alcohol dependence and treatment.   CLINICAL FACTORS:  49 year old male , long history of alcohol dependence , has been drinking daily and heavily, presented to ED requesting detox and reporting depression. Currently no SI.    Psychiatric Specialty Exam: Physical Exam  ROS  Blood pressure 124/70, pulse 97, temperature 97.6 F (36.4 C), temperature source Oral, resp. rate 18, height  (1.676 m), weight 153 lb (69.4 kg).Body mass index is 24.71 kg/(m^2).  See Admit Note MSE                                                        COGNITIVE FEATURES THAT CONTRIBUTE TO RISK:  Loss of executive function    SUICIDE RISK:   Moderate:  Frequent suicidal ideation with limited  intensity, and duration, some specificity in terms of plans, no associated intent, good self-control, limited dysphoria/symptomatology, some risk factors present, and identifiable protective factors, including available and accessible social support.  PLAN OF CARE: Patient will be admitted to inpatient psychiatric unit for stabilization and safety. Will provide and encourage milieu participation. Provide medication management and maked adjustments as needed. Medication management to minimize risk of WDL.  Will follow daily.    Medical Decision Making:  New problem, with additional work up planned, Review of Psycho-Social Stressors (1), Established Problem, Worsening (2) and Review of New Medication or Change in Dosage (2)  I certify that inpatient services furnished can reasonably be expected to improve the patient's condition.   Aloni Chuang 11/27/2014, 11:22 AM

## 2014-11-27 NOTE — BH Assessment (Signed)
Received notification of TTS consult request. Spoke to Earley Favor, NP who said Pt wants to stop drinking and says he will drink himself to death. Tele-assessment will be initiated.  Harlin Rain Patsy [place], LPC, [facility], [facility] Triage Specialist [phone]

## 2014-11-27 NOTE — Progress Notes (Signed)
Patient ID: Darren Graham, male   DOB: Dec 12, 1965, 49 y.o.   MRN: [id number] Pt admitted to [facility] requesting detox from ETOH and cocaine. Pt also reported prior to arrival he had self-harm thoughts with no plan. Pt denied previous suicide attempts. Pt reports he has been drinking 8-10 40s daily and using $50-$60 of cocaine sporadically.  Pt is homeless and denies having a support system. Pt denied legal concerns.  This is patient's first admission to [facility]. Fifteen minute checks initiated for patient safety. Pt safe on unit.

## 2014-11-27 NOTE — ED Provider Notes (Signed)
CSN: [id number]     Arrival date & time 11/26/14  2355 History   First MD Initiated Contact with Patient 11/27/14 0102     Chief Complaint  Patient presents with  . Alcohol Problem  . Suicidal     (Consider location/radiation/quality/duration/timing/severity/associated sxs/prior Treatment) HPI Comments: Vision is an alcoholic, who wants to stop.  He also states he's suicidal.  His plan for suicide is to drink himself to death.  This is a passive suicidality  He states he's been to AA and tried numerous therapies on his own without any relief.  Going back to alcohol.  He states that he's had some episodes of vomiting recently  Patient is a 49 y.o. male presenting with alcohol problem. The history is provided by the patient.  Alcohol Problem This is a chronic problem. The problem occurs constantly. The problem has been unchanged. Associated symptoms include vomiting. Pertinent negatives include no abdominal pain, chest pain, coughing or fever. Nothing aggravates the symptoms. He has tried nothing for the symptoms. The treatment provided no relief.    Past Medical History  Diagnosis Date  . Arthritis    History reviewed. No pertinent past surgical history. No family history on file. Social History  Substance Use Topics  . Smoking status: Current Every Day Smoker -- 1.00 packs/day    Types: Cigarettes  . Smokeless tobacco: None  . Alcohol Use: Yes    Review of Systems  Constitutional: Negative for fever.  Respiratory: Negative for cough.   Cardiovascular: Negative for chest pain.  Gastrointestinal: Positive for vomiting. Negative for abdominal pain and abdominal distention.  Musculoskeletal: Negative for back pain.  Psychiatric/Behavioral: Positive for suicidal ideas.      Allergies  Review of patient's allergies indicates no known allergies.  Home Medications   Prior to Admission medications   Medication Sig Start Date End Date Taking? Authorizing Provider   acetaminophen (TYLENOL) 650 MG CR tablet Take 650 mg by mouth every 8 (eight) hours as needed for pain.   Yes Historical Provider, MD  meloxicam (MOBIC) 15 MG tablet Take 15 mg by mouth daily.   Yes Historical Provider, MD   BP 101/50 mmHg  Pulse 78  Temp(Src) 98 F (36.7 C) (Oral)  Resp 16  SpO2 99% Physical Exam  Constitutional: He appears well-developed and well-nourished.  HENT:  Head: Normocephalic.  Eyes: Pupils are equal, round, and reactive to light.  Neck: Normal range of motion.  Cardiovascular: Normal rate.   Pulmonary/Chest: Effort normal.  Musculoskeletal: Normal range of motion.  Neurological: He is alert.  Skin: Skin is warm.  Psychiatric: He has a normal mood and affect. His behavior is normal. Cognition and memory are normal. He expresses inappropriate judgment. He expresses suicidal ideation. He expresses suicidal plans.  Vitals reviewed.   ED Course  Procedures (including critical care time) Labs Review Labs Reviewed  ETHANOL - Abnormal; Notable for the following:    Alcohol, Ethyl (B) 131 (*)    All other components within normal limits  ACETAMINOPHEN LEVEL - Abnormal; Notable for the following:    Acetaminophen (Tylenol), Serum <10 (*)    All other components within normal limits  COMPREHENSIVE METABOLIC PANEL  URINE RAPID DRUG SCREEN, HOSP PERFORMED  CBC  SALICYLATE LEVEL    Imaging Review No results found. I, Eddie Payette K, personally reviewed and evaluated these images and lab results as part of my medical decision-making.   EKG Interpretation None     We'll move to psych unit for further evaluation  CIWA protocol Patient has been accepted at behavioral health Hospital, but a better will not be available until morning MDM   Final diagnoses:  Alcohol intoxication, uncomplicated  Depression        Earley Favor, NP 11/27/14 0112  Earley Favor, NP 11/27/14 9629  Tomasita Crumble, MD 11/27/14 [id number]

## 2014-11-27 NOTE — ED Notes (Signed)
Sprite given per pt request. Pt made aware of bed availibilty at [facility]. This RN awaiting  Discharge/ transfer orders.

## 2014-11-27 NOTE — ED Notes (Addendum)
[facility] transport at bedside. Pt leaving with all belongings he brought.

## 2014-11-27 NOTE — Progress Notes (Signed)
[facility] Group Notes:  (Nursing/MHT/Case Management/Adjunct)  Date:  11/27/2014  Time:  2045  Type of Therapy:  wrap up group  Participation Level:  Active  Participation Quality:  Appropriate, Attentive, Sharing and Supportive  Affect:  Lethargic  Cognitive:  Appropriate  Insight:  Improving  Engagement in Group:  Engaged  Modes of Intervention:  Clarification, Education and Support  Summary of Progress/Problems:  Darren Graham 11/27/2014, 9:58 PM

## 2014-11-27 NOTE — Tx Team (Signed)
Initial Interdisciplinary Treatment Plan   PATIENT STRESSORS: Financial difficulties Substance abuse   PATIENT STRENGTHS: Ability for insight Communication skills General fund of knowledge Motivation for treatment/growth   PROBLEM LIST: Problem List/Patient Goals Date to be addressed Date deferred Reason deferred Estimated date of resolution  Coping skills "learning to cope with my different feelings/behaviors" 11/27/14     depression 11/27/14     Substance abuse "I want to be able to get a handle on it and keep it together" 11/27/14     Suicidal ideation 11/27/14                                    DISCHARGE CRITERIA:  Ability to meet basic life and health needs Adequate post-discharge living arrangements Improved stabilization in mood, thinking, and/or behavior Motivation to continue treatment in a less acute level of care Need for constant or close observation no longer present Reduction of life-threatening or endangering symptoms to within safe limits Verbal commitment to aftercare and medication compliance Withdrawal symptoms are absent or subacute and managed without 24-hour nursing intervention  PRELIMINARY DISCHARGE PLAN: Attend aftercare/continuing care group  PATIENT/FAMIILY INVOLVEMENT: This treatment plan has been presented to and reviewed with the patient, Darren Graham.  The patient and family have been given the opportunity to ask questions and make suggestions.  Darren Graham 11/27/2014, 5:10 AM

## 2014-11-27 NOTE — [facility] Group Notes (Signed)
[facility] Group Notes: (Clinical Social Work)   11/27/2014      Type of Therapy:  Group Therapy   Participation Level:  Did Not Attend despite MHT prompting   Mcclellan Demarais Grossman-Orr, LCSW 11/27/2014, 11:57 AM     

## 2014-11-27 NOTE — ED Notes (Signed)
Report given to [facility] at [facility]

## 2014-11-27 NOTE — Plan of Care (Signed)
Problem: Alteration in mood; excessive anxiety as evidenced by: Goal: STG-Pt will report an absence of self-harm thoughts/actions (Patient will report an absence of self-harm thoughts or actions)  Outcome: Progressing Pt denies SI /HI at this time   Problem: Alteration in mood & ability to function due to Goal: LTG-Pt reports reduction in suicidal thoughts (Patient reports reduction in suicidal thoughts and is able to verbalize a safety plan for whenever patient is feeling suicidal)  Outcome: Progressing Pt denies SI at this time
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
## 2014-11-27 NOTE — ED Notes (Addendum)
Attempted to call report to [facility] and was told they are not accepting patient's until after shift change. Spoke to [facility] and made her aware that [facility]esle[facility] beds are at capacity and patient would have to sleep in triage if not transferred to [facility]. Joann reports to go ahead and send patient over.

## 2014-11-27 NOTE — Progress Notes (Signed)
D: Pt denies SI/HI/AVH. Pt is pleasant and cooperative. Pt appears to want to get help, pt forwards little, but seems to want to get himself right.   A: Pt was offered support and encouragement. Pt was given scheduled medications. Pt was encourage to attend groups. Q 15 minute checks were done for safety.   R:Pt attends groups and interacts well with peers and staff. Pt is taking medication. Pt has no complaints at this time .Pt receptive to treatment and safety maintained on unit.

## 2014-11-28 DIAGNOSIS — F329 Major depressive disorder, single episode, unspecified: Secondary | ICD-10-CM

## 2014-11-28 DIAGNOSIS — G4700 Insomnia, unspecified: Secondary | ICD-10-CM

## 2014-11-28 MED ORDER — METHOCARBAMOL 500 MG PO TABS
ORAL_TABLET | ORAL | Status: AC
Start: 2014-11-28 — End: 2014-11-28
  Administered 2014-11-28: 500 mg via ORAL
  Filled 2014-11-28: qty 1

## 2014-11-28 MED ORDER — GABAPENTIN 100 MG PO CAPS
ORAL_CAPSULE | ORAL | Status: AC
  Administered 2014-11-28: 100 mg via ORAL
  Filled 2014-11-28: qty 1

## 2014-11-28 MED ORDER — METHOCARBAMOL 500 MG PO TABS
500.0000 mg | ORAL_TABLET | Freq: Four times a day (QID) | ORAL | Status: AC
  Administered 2014-11-28 – 2014-12-03 (×18): 500 mg via ORAL
  Filled 2014-11-28 (×21): qty 1

## 2014-11-28 MED ORDER — GABAPENTIN 100 MG PO CAPS
100.0000 mg | ORAL_CAPSULE | Freq: Three times a day (TID) | ORAL | Status: DC
  Administered 2014-11-28 – 2014-11-29 (×2): 100 mg via ORAL
  Filled 2014-11-28 (×5): qty 1

## 2014-11-28 NOTE — Evaluation (Signed)
Physical Therapy One Time Evaluation Patient Details Name: Darren Graham MRN: [id number] DOB: 08-02-1965 Today's Date: 11/28/2014   History of Present Illness  Pt is a 49 year old male admitted to [facility] for detox due to alcohol dependence, also cocaine abuse with hx of fibromyalgia and arthritis.  Clinical Impression  Patient evaluated by Physical Therapy with no further acute PT needs identified. All education has been completed and the patient has no further questions.  See below for any follow-up Physical Therapy or equipment needs. PT is signing off. Thank you for this referral.  Pt presents with neurological symptoms (please refer to Extremity/Trunk Assessment section) and recommended f/u with MD upon d/c from [facility].  Pt reports symptoms intermittently regardless of positioning for the past 3 weeks.  Pt educated that findings today are not consistent with arthritis diagnosis (R hip) as pt was wondering if this was originating from hip.  Pt would benefit from RW upon d/c as well for safety as he reports his symptoms sometimes occur during gait.  Gait pattern also appears rigid and effortful, especially of R LE.     Follow Up Recommendations No PT follow up    Equipment Recommendations  Rolling walker with 5" wheels    Recommendations for Other Services       Precautions / Restrictions Precautions Precautions: Fall      Mobility  Bed Mobility Overal bed mobility: Modified Independent                Transfers Overall transfer level: Modified independent                  Ambulation/Gait Ambulation/Gait assistance: Modified independent (Device/Increase time) Ambulation Distance (Feet): 200 Feet Assistive device: Rolling walker (2 wheeled) Gait Pattern/deviations: Step-through pattern     General Gait Details: rigid appearing effortful gait, especially of R LE, pt ambulating with RW prior to visit so readjusted to pt's height, pt also reports "locking up" of R LE  (similar to spasticity observed with MMT per pt) during gait at times however not observed this visit  Stairs            Wheelchair Mobility    Modified Rankin (Stroke Patients Only)       Balance                                             Pertinent Vitals/Pain Pain Assessment: 0-10 Pain Score: 3  Pain Location: R hip Pain Descriptors / Indicators: Aching Pain Intervention(s): Monitored during session    Home Living Family/patient expects to be discharged to:: Shelter/Homeless                      Prior Function Level of Independence: Independent               Hand Dominance        Extremity/Trunk Assessment   Upper Extremity Assessment: RUE deficits/detail RUE Deficits / Details: reports occasional numbness and tingling of entire R UE, also states at times difficulty with shoulder movement         Lower Extremity Assessment: RLE deficits/detail (MMT 4+/5 throughout LEs) RLE Deficits / Details: spasticity present with resistance during knee extension MMT in sitting (uncontrolled muscle contractions of knee extensors, PF, and foot inversion) approx 5-6 sec duration, able to elicit again, not present with passive motion of  LE       Communication   Communication: No difficulties  Cognition Arousal/Alertness: Awake/alert Behavior During Therapy: WFL for tasks assessed/performed Overall Cognitive Status: Within Functional Limits for tasks assessed                      General Comments      Exercises        Assessment/Plan    PT Assessment Patent does not need any further PT services  PT Diagnosis Difficulty walking   PT Problem List    PT Treatment Interventions     PT Goals (Current goals can be found in the Care Plan section) Acute Rehab PT Goals PT Goal Formulation: All assessment and education complete, DC therapy    Frequency     Barriers to discharge        Co-evaluation                End of Session   Activity Tolerance: Patient tolerated treatment well Patient left: Other (comment) (hallway with RW) Nurse Communication:  (recommending RW for home, f/u with MD, possible neurologic condition)    Functional Assessment Tool Used: clinical judgement Functional Limitation: Mobility: Walking and moving around Mobility: Walking and Moving Around Current Status ([id number]): At least 1 percent but less than 20 percent impaired, limited or restricted Mobility: Walking and Moving Around Goal Status [phone]): At least 1 percent but less than 20 percent impaired, limited or restricted Mobility: Walking and Moving Around Discharge Status [phone]): At least 1 percent but less than 20 percent impaired, limited or restricted    Time: 1051-1108 PT Time Calculation (min) (ACUTE ONLY): 17 min   Charges:   PT Evaluation $Initial PT Evaluation Tier I: 1 Procedure     PT G Codes:   PT G-Codes **NOT FOR INPATIENT CLASS** Functional Assessment Tool Used: clinical judgement Functional Limitation: Mobility: Walking and moving around Mobility: Walking and Moving Around Current Status (B1478): At least 1 percent but less than 20 percent impaired, limited or restricted Mobility: Walking and Moving Around Goal Status [phone]): At least 1 percent but less than 20 percent impaired, limited or restricted Mobility: Walking and Moving Around Discharge Status [phone]): At least 1 percent but less than 20 percent impaired, limited or restricted    Tan Clopper,KATHrine E 11/28/2014, 11:54 AM Zenovia Jarred, PT, DPT 11/28/2014 Pager: [phone]

## 2014-11-28 NOTE — Progress Notes (Signed)
NUTRITION ASSESSMENT  Pt identified as at risk on the Malnutrition Screen Tool  INTERVENTION: 1. Educated patient on the importance of nutrition and encouraged intake of food and beverages. 2. Discussed weight goals. 3. Supplements: Ensure Enlive po BID, each supplement provides 350 kcal and 20 grams of protein  NUTRITION DIAGNOSIS: Unintentional weight loss related to sub-optimal intake as evidenced by pt report.   Goal: Pt to meet >/= 90% of their estimated nutrition needs.  Monitor:  PO intake  Assessment:  Pt admitted with ETOH abuse and cocaine use. Pt is currently homeless.  Pt reports drinking 400 oz of beer a day per H&P. Pt would benefit from nutritional supplementation, RD to order.  No weight history provided by EPIC.  Height: Ht Readings from Last 1 Encounters:  11/27/14  (1.676 m)    Weight: Wt Readings from Last 1 Encounters:  11/27/14 153 lb (69.4 kg)    Weight Hx: Wt Readings from Last 10 Encounters:  11/27/14 153 lb (69.4 kg)    BMI:  Body mass index is 24.71 kg/(m^2). Pt meets criteria for normal range based on current BMI.  Estimated Nutritional Needs: Kcal: 25-30 kcal/kg Protein: > 1 gram protein/kg Fluid: 1 ml/kcal  Diet Order: Diet regular Room service appropriate?: Yes; Fluid consistency:: Thin Pt is also offered choice of unit snacks mid-morning and mid-afternoon.  Pt is eating as desired.   Lab results and medications reviewed.   Tilda Franco, MS, RD, LDN Pager: [phone] After Hours Pager: [phone]

## 2014-11-28 NOTE — Progress Notes (Signed)
Patient did attend the evening speaker AA meeting.  

## 2014-11-28 NOTE — BHH Group Notes (Signed)
[facility] Group Notes:  (Clinical Social Work)  11/28/2014  10:00-11:00AM  Summary of Progress/Problems:   The main focus of today's process group was to   1)  discuss the importance of adding supports  2)  define health supports versus unhealthy supports  3)  identify the patient's current unhealthy supports and plan how to handle them  4)  Identify the patient's current healthy supports and plan what to add.  An emphasis was placed on using counselor, doctor, therapy groups, 12-step groups, and problem-specific support groups to expand supports.    The patient expressed full comprehension of the concepts presented, and agreed that there is a need to add more supports.  He stated he currently has no support in his life at all, except the little self-support he can give on the days he is in a good place. The patient stated he needs help with coming up with ideas for some additional supports, and just when group was starting to address this, Physical Therapy consult staff arrived.  Type of Therapy:  Process Group with Motivational Interviewing  Participation Level:  Active  Participation Quality:  Attentive and Sharing  Affect:  Blunted  Cognitive:  Alert and Appropriate  Insight:  Engaged  Engagement in Therapy:  Engaged  Modes of Intervention:   Education, Support and Processing, Activity  Ambrose Mantle, LCSW 11/28/2014

## 2014-11-28 NOTE — [facility] Suicide Risk Assessment (Signed)
[facility] INPATIENT:  Family/Significant Other Suicide Prevention Education  Suicide Prevention Education:  Patient Refusal for Family/Significant Other Suicide Prevention Education: The patient Darren Graham has refused to provide written consent for family/significant other to be provided Family/Significant Other Suicide Prevention Education during admission and/or prior to discharge.  Physician notified.  Patient stated that he would "love" for this information to be given to his family, but he has been unable to contact them, does not have contact information.  Sarina Ser 11/28/2014, 8:24 AM

## 2014-11-28 NOTE — Progress Notes (Signed)
Patient ID: Darren Graham, male   DOB: 1965-12-17, 49 y.o.   MRN: [id number]   D: Patient continues to have difficulty ambulating due to severe right hip pain, PT did see patient and reported that patient needed a walker to ambulate while at [facility] and when he goes home. PT reported that the patients issues seem to be more neurological and that he needed to be referred for an evaluation. PT reported that they would document all information in their note, Aggie NP was made aware. New orders were noted for medication to help patient with pain. Pt continues to be very flat and depressed, however attended all groups and engaged in treatment. Pt reported being negative SI/HI, no AH/VH noted. A: 15 min checks continued for patient safety. R: Pt safety maintained.

## 2014-11-28 NOTE — Progress Notes (Signed)
[facility] MD Progress Note  11/28/2014 4:34 PM Darren Graham  MRN:  [id number]  Subjective: Darren Graham is here for alcohol use disorder. He is currently receiving alcohol detox treatment and medication for depression. His main complaints today is pain & problem with ambulation. He is seen by the Physical therapist for evaluation. He was recommended to use walker inpatient & after discharge. Robaxin 500 mg & Neurontin 100 mg added to his treatment regimen for pain management. He currently denies any SIHI, AVH.  Principal Problem: Alcohol use disorder, severe, dependence  Diagnosis:   Patient Active Problem List   Diagnosis Date Noted  . Substance induced mood disorder [F19.94] 11/27/2014  . Alcohol use disorder, severe, dependence [F10.20] 11/27/2014  . Cocaine use disorder, moderate, dependence [F14.20] 11/27/2014   Total Time spent with patient: 25 minutes  Past Medical History:  Past Medical History  Diagnosis Date  . Arthritis   . Fibromyalgia    History reviewed. No pertinent past surgical history. Family History: History reviewed. No pertinent family history.  Social History:  History  Alcohol Use  . Yes    Comment: 8-10 40 ounces daily     History  Drug Use  . Yes  . Special: Cocaine    Comment: $50-$60 sporadic    Social History   Social History  . Marital Status: Single    Spouse Name: N/A  . Number of Children: N/A  . Years of Education: N/A   Social History Main Topics  . Smoking status: Current Every Day Smoker -- 0.50 packs/day for 35 years    Types: Cigarettes  . Smokeless tobacco: None  . Alcohol Use: Yes     Comment: 8-10 40 ounces daily  . Drug Use: Yes    Special: Cocaine     Comment: $50-$60 sporadic  . Sexual Activity: Yes    Birth Control/ Protection: Condom   Other Topics Concern  . None   Social History Narrative   Additional History:    Sleep: Fair  Appetite:  Fair  Musculoskeletal: Strength & Muscle Tone: within normal limits Gait &  Station: unsteady, drags both legs when ambulating. Patient leans: N/A  Psychiatric Specialty Exam: Physical Exam  Review of Systems  Constitutional: Positive for chills, malaise/fatigue and diaphoresis.  HENT: Negative.   Eyes: Negative.   Respiratory: Negative.   Cardiovascular: Negative.   Genitourinary: Negative.   Musculoskeletal: Positive for myalgias, back pain, joint pain and falls (At risks for falls).  Skin: Negative.   Neurological: Positive for tremors and weakness.  Endo/Heme/Allergies: Negative.   Psychiatric/Behavioral: Positive for depression. Negative for suicidal ideas, hallucinations and memory loss. Substance abuse: Alcoholism, chronic. The patient is nervous/anxious and has insomnia.     Blood pressure 103/56, pulse 66, temperature 97.8 F (36.6 C), temperature source Oral, resp. rate 24, height _0  (1.676 m), weight 69.4 kg (153 lb).Body mass index is 24.71 kg/(m^2).  General Appearance: Casual  Eye Contact::  Good  Speech:  Clear and Coherent  Volume:  Normal  Mood:  Depressed  Affect:  Flat  Thought Process:  Coherent and Goal Directed  Orientation:  Full (Time, Place, and Person)  Thought Content:  Rumination and denies any hallucinations  Suicidal Thoughts:  No  Homicidal Thoughts:  No  Memory:  Immediate;   Good Recent;   Good Remote;   Good  Judgement:  Fair  Insight:  Fair  Psychomotor Activity:  Tremor and generalized weakness  Concentration:  Fair  Recall:  [employer] of  Knowledge:Fair  Language: Good  Akathisia:  No  Handed:  Right  AIMS (if indicated):     Assets:  Desire for Improvement  ADL's:  Intact  Cognition: WNL  Sleep:  Number of Hours: 6.25   Current Medications: Current Facility-Administered Medications  Medication Dose Route Frequency Provider Last Rate Last Dose  . acetaminophen (TYLENOL) tablet 650 mg  650 mg Oral Q6H PRN Harriet Butte, NP      . alum & mag hydroxide-simeth (MAALOX/MYLANTA) 200-200-20 MG/5ML  suspension 30 mL  30 mL Oral Q4H PRN Harriet Butte, NP      . feeding supplement (ENSURE ENLIVE) (ENSURE ENLIVE) liquid 237 mL  237 mL Oral BID BM Nicholaus Bloom, MD   237 mL at 11/28/14 1152  . FLUoxetine (PROZAC) capsule 20 mg  20 mg Oral Daily Jenne Campus, MD   20 mg at 11/28/14 8676  . hydrOXYzine (ATARAX/VISTARIL) tablet 25 mg  25 mg Oral Q6H PRN Jenne Campus, MD      . loperamide (IMODIUM) capsule 2-4 mg  2-4 mg Oral PRN Jenne Campus, MD      . LORazepam (ATIVAN) tablet 1 mg  1 mg Oral Q6H PRN Myer Peer Cobos, MD      . LORazepam (ATIVAN) tablet 1 mg  1 mg Oral TID Jenne Campus, MD   1 mg at 11/28/14 1152   Followed by  . [START ON 11/29/2014] LORazepam (ATIVAN) tablet 1 mg  1 mg Oral BID Jenne Campus, MD       Followed by  . [START ON 12/01/2014] LORazepam (ATIVAN) tablet 1 mg  1 mg Oral Daily Fernando A Cobos, MD      . magnesium hydroxide (MILK OF MAGNESIA) suspension 30 mL  30 mL Oral Daily PRN Harriet Butte, NP      . meloxicam (MOBIC) tablet 15 mg  15 mg Oral Daily Harriet Butte, NP   15 mg at 11/28/14 0806  . multivitamin with minerals tablet 1 tablet  1 tablet Oral Daily Jenne Campus, MD   1 tablet at 11/28/14 0806  . nicotine polacrilex (NICORETTE) gum 2 mg  2 mg Oral PRN Nicholaus Bloom, MD      . ondansetron (ZOFRAN-ODT) disintegrating tablet 4 mg  4 mg Oral Q6H PRN Jenne Campus, MD      . pantoprazole (PROTONIX) EC tablet 20 mg  20 mg Oral Daily Jenne Campus, MD   20 mg at 11/28/14 0806  . pneumococcal 23 valent vaccine (PNU-IMMUNE) injection 0.5 mL  0.5 mL Intramuscular Tomorrow-1000 Nicholaus Bloom, MD   0.5 mL at 11/28/14 1000  . thiamine (VITAMIN B-1) tablet 100 mg  100 mg Oral Daily Jenne Campus, MD   100 mg at 11/28/14 0806  . traZODone (DESYREL) tablet 50 mg  50 mg Oral QHS PRN Harriet Butte, NP       Lab Results:  Results for orders placed or performed during the hospital encounter of 11/27/14 (from the past 48 hour(s))   Comprehensive metabolic panel     Status: None   Collection Time: 11/27/14  1:04 AM  Result Value Ref Range   Sodium 137 135 - 145 mmol/L   Potassium 3.7 3.5 - 5.1 mmol/L   Chloride 102 101 - 111 mmol/L   CO2 25 22 - 32 mmol/L   Glucose, Bld 88 65 - 99 mg/dL   BUN 6 6 - 20 mg/dL  Creatinine, Ser 0.76 0.61 - 1.24 mg/dL   Calcium 9.2 8.9 - 10.3 mg/dL   Total Protein 7.8 6.5 - 8.1 g/dL   Albumin 4.3 3.5 - 5.0 g/dL   AST 41 15 - 41 U/L   ALT 26 17 - 63 U/L   Alkaline Phosphatase 56 38 - 126 U/L   Total Bilirubin 0.4 0.3 - 1.2 mg/dL   GFR calc non Af Amer >60 >60 mL/min   GFR calc Af Amer >60 >60 mL/min    Comment: (NOTE) The eGFR has been calculated using the CKD EPI equation. This calculation has not been validated in all clinical situations. eGFR's persistently <60 mL/min signify possible Chronic Kidney Disease.    Anion gap 10 5 - 15  Ethanol (ETOH)     Status: Abnormal   Collection Time: 11/27/14  1:04 AM  Result Value Ref Range   Alcohol, Ethyl (B) 131 (H) <5 mg/dL    Comment:        LOWEST DETECTABLE LIMIT FOR SERUM ALCOHOL IS 5 mg/dL FOR MEDICAL PURPOSES ONLY   Urine rapid drug screen (hosp performed) (Not at [facility])     Status: None   Collection Time: 11/27/14  1:04 AM  Result Value Ref Range   Opiates NONE DETECTED NONE DETECTED   Cocaine NONE DETECTED NONE DETECTED   Benzodiazepines NONE DETECTED NONE DETECTED   Amphetamines NONE DETECTED NONE DETECTED   Tetrahydrocannabinol NONE DETECTED NONE DETECTED   Barbiturates NONE DETECTED NONE DETECTED    Comment:        DRUG SCREEN FOR MEDICAL PURPOSES ONLY.  IF CONFIRMATION IS NEEDED FOR ANY PURPOSE, NOTIFY LAB WITHIN 5 DAYS.        LOWEST DETECTABLE LIMITS FOR URINE DRUG SCREEN Drug Class       Cutoff (ng/mL) Amphetamine      1000 Barbiturate      200 Benzodiazepine   882 Tricyclics       800 Opiates          300 Cocaine          300 THC              50   CBC     Status: None   Collection Time: 11/27/14   1:04 AM  Result Value Ref Range   WBC 9.2 4.0 - 10.5 K/uL   RBC 4.36 4.22 - 5.81 MIL/uL   Hemoglobin 13.7 13.0 - 17.0 g/dL   HCT 39.6 39.0 - 52.0 %   MCV 90.8 78.0 - 100.0 fL   MCH 31.4 26.0 - 34.0 pg   MCHC 34.6 30.0 - 36.0 g/dL   RDW 13.3 11.5 - 15.5 %   Platelets 239 349 - 179 K/uL  Salicylate level     Status: None   Collection Time: 11/27/14  1:04 AM  Result Value Ref Range   Salicylate Lvl <1.5 2.8 - 30.0 mg/dL  Acetaminophen level     Status: Abnormal   Collection Time: 11/27/14  1:04 AM  Result Value Ref Range   Acetaminophen (Tylenol), Serum <10 (L) 10 - 30 ug/mL    Comment:        THERAPEUTIC CONCENTRATIONS VARY SIGNIFICANTLY. A RANGE OF 10-30 ug/mL MAY BE AN EFFECTIVE CONCENTRATION FOR MANY PATIENTS. HOWEVER, SOME ARE BEST TREATED AT CONCENTRATIONS OUTSIDE THIS RANGE. ACETAMINOPHEN CONCENTRATIONS >150 ug/mL AT 4 HOURS AFTER INGESTION AND >50 ug/mL AT 12 HOURS AFTER INGESTION ARE OFTEN ASSOCIATED WITH TOXIC REACTIONS.     Physical Findings: AIMS: Facial  and Oral Movements Muscles of Facial Expression: None, normal Lips and Perioral Area: None, normal Jaw: None, normal Tongue: None, normal,Extremity Movements Upper (arms, wrists, hands, fingers): None, normal Lower (legs, knees, ankles, toes): None, normal, Trunk Movements Neck, shoulders, hips: None, normal, Overall Severity Severity of abnormal movements (highest score from questions above): None, normal Incapacitation due to abnormal movements: None, normal Patient's awareness of abnormal movements (rate only patient's report): No Awareness, Dental Status Current problems with teeth and/or dentures?: Yes Does patient usually wear dentures?: No  CIWA:  CIWA-Ar Total: 3 COWS:     Treatment Plan Summary: Daily contact with patient to assess and evaluate symptoms and progress in treatment and Medication management:  Plan: Supportive approach/coping skills/relapse prevention           Depression:  Continue Prozac 20 mg and continue to work with mindfulness, CBT help  identify the cognitive distortions that keep the depression going          Discuss other life style changes that can help with both his depression and his alcohol use such like exercise, meditation           Alcohol Dependence: will continue the Ativan detox protocol, will use motivational interviewing and encourage to pursue total abstinence. Insomnia: Continue Trazodone 50 mg. Problem with mobility: Was evaluated by the Physical therapy today. Was recommended to use walker while inpatient & after discharge. Muscle pain: initiate Robaxin 500 mg qid & Neurontin 100 mg tid.           Medical Decision Making:  Review of Last Therapy Session (1), Review of Medication Regimen & Side Effects (2) and Review of New Medication or Change in Dosage (2)  Encarnacion Slates, PMHNP, FNP-BC 11/28/2014, 4:34 PM Agree with  NP  Progress Note, as above  Neita Garnet, MD

## 2014-11-29 DIAGNOSIS — R2681 Unsteadiness on feet: Secondary | ICD-10-CM

## 2014-11-29 DIAGNOSIS — F1420 Cocaine dependence, uncomplicated: Secondary | ICD-10-CM

## 2014-11-29 MED ORDER — GABAPENTIN 100 MG PO CAPS
200.0000 mg | ORAL_CAPSULE | Freq: Three times a day (TID) | ORAL | Status: DC
  Administered 2014-11-29 – 2014-12-05 (×19): 200 mg via ORAL
  Filled 2014-11-29 (×25): qty 2

## 2014-11-29 NOTE — Progress Notes (Signed)
[facility] MD Progress Note  11/29/2014 11:03 AM Darren Graham  MRN:  [id number] Subjective:  States he wants help for his depression and substance abuse. He also states that he has developed spontaneous muscle contractions in his left leg. He has a history of arthritis in his hip and initially thought it was secondary to it. He has some numbness in that leg. Wants to be able to go to a residential treatment program. He is currently homeless and has been homeless for a while.  Principal Problem: Alcohol use disorder, severe, dependence Diagnosis:   Patient Active Problem List   Diagnosis Date Noted  . Substance induced mood disorder [F19.94] 11/27/2014  . Alcohol use disorder, severe, dependence [F10.20] 11/27/2014  . Cocaine use disorder, moderate, dependence [F14.20] 11/27/2014   Total Time spent with patient: 30 minutes   Past Medical History:  Past Medical History  Diagnosis Date  . Arthritis   . Fibromyalgia    History reviewed. No pertinent past surgical history. Family History: History reviewed. No pertinent family history. Social History:  History  Alcohol Use  . Yes    Comment: 8-10 40 ounces daily     History  Drug Use  . Yes  . Special: Cocaine    Comment: $50-$60 sporadic    Social History   Social History  . Marital Status: Single    Spouse Name: N/A  . Number of Children: N/A  . Years of Education: N/A   Social History Main Topics  . Smoking status: Current Every Day Smoker -- 0.50 packs/day for 35 years    Types: Cigarettes  . Smokeless tobacco: None  . Alcohol Use: Yes     Comment: 8-10 40 ounces daily  . Drug Use: Yes    Special: Cocaine     Comment: $50-$60 sporadic  . Sexual Activity: Yes    Birth Control/ Protection: Condom   Other Topics Concern  . None   Social History Narrative   Additional History:    Sleep: Poor  Appetite:  Fair   Assessment:   Musculoskeletal: Strength & Muscle Tone: within normal limits Gait & Station: using a  walker to help himself ambulate Patient leans: normal   Psychiatric Specialty Exam: Physical Exam  Review of Systems  Constitutional: Positive for weight loss and malaise/fatigue.  HENT:       Pressure  Eyes: Positive for blurred vision.  Respiratory:       Half a pack a day  Cardiovascular: Negative.   Gastrointestinal: Positive for nausea and vomiting.  Genitourinary: Negative.   Musculoskeletal: Positive for back pain and joint pain.  Skin: Positive for itching.  Neurological: Positive for dizziness, weakness and headaches.  Endo/Heme/Allergies: Negative.   Psychiatric/Behavioral: Positive for depression and substance abuse. The patient is nervous/anxious.     Blood pressure 131/82, pulse 70, temperature 97.7 F (36.5 C), temperature source Oral, resp. rate 20, height  (1.676 m), weight 69.4 kg (153 lb).Body mass index is 24.71 kg/(m^2).  General Appearance: Fairly Groomed  Patent attorney::  Fair  Speech:  Clear and Coherent  Volume:  Decreased  Mood:  Anxious and Depressed  Affect:  Depressed and anxious worried  Thought Process:  Coherent and Goal Directed  Orientation:  Full (Time, Place, and Person)  Thought Content:  symptoms events worries concerns  Suicidal Thoughts:  No  Homicidal Thoughts:  No  Memory:  Immediate;   Fair Recent;   Fair Remote;   Fair  Judgement:  Fair  Insight:  Present  and Shallow  Psychomotor Activity:  Restlessness  Concentration:  Fair  Recall:  [employer] of Knowledge:Fair  Language: Fair  Akathisia:  No  Handed:  Right  AIMS (if indicated):     Assets:  Desire for Improvement  ADL's:  Intact  Cognition: WNL  Sleep:  Number of Hours: 6.25     Current Medications: Current Facility-Administered Medications  Medication Dose Route Frequency Provider Last Rate Last Dose  . acetaminophen (TYLENOL) tablet 650 mg  650 mg Oral Q6H PRN Worthy Flank, NP      . alum & mag hydroxide-simeth (MAALOX/MYLANTA) 200-200-20 MG/5ML  suspension 30 mL  30 mL Oral Q4H PRN Worthy Flank, NP      . feeding supplement (ENSURE ENLIVE) (ENSURE ENLIVE) liquid 237 mL  237 mL Oral BID BM Rachael Fee, MD   237 mL at 11/29/14 0750  . FLUoxetine (PROZAC) capsule 20 mg  20 mg Oral Daily Craige Cotta, MD   20 mg at 11/29/14 0746  . gabapentin (NEURONTIN) capsule 100 mg  100 mg Oral TID Sanjuana Kava, NP   100 mg at 11/29/14 0744  . hydrOXYzine (ATARAX/VISTARIL) tablet 25 mg  25 mg Oral Q6H PRN Craige Cotta, MD      . loperamide (IMODIUM) capsule 2-4 mg  2-4 mg Oral PRN Craige Cotta, MD      . LORazepam (ATIVAN) tablet 1 mg  1 mg Oral Q6H PRN Rockey Situ Cobos, MD      . LORazepam (ATIVAN) tablet 1 mg  1 mg Oral BID Craige Cotta, MD       Followed by  . [START ON 12/01/2014] LORazepam (ATIVAN) tablet 1 mg  1 mg Oral Daily Fernando A Cobos, MD      . magnesium hydroxide (MILK OF MAGNESIA) suspension 30 mL  30 mL Oral Daily PRN Worthy Flank, NP      . meloxicam (MOBIC) tablet 15 mg  15 mg Oral Daily Worthy Flank, NP   15 mg at 11/29/14 0744  . methocarbamol (ROBAXIN) tablet 500 mg  500 mg Oral QID Sanjuana Kava, NP   500 mg at 11/29/14 0744  . multivitamin with minerals tablet 1 tablet  1 tablet Oral Daily Craige Cotta, MD   1 tablet at 11/29/14 0744  . nicotine polacrilex (NICORETTE) gum 2 mg  2 mg Oral PRN Rachael Fee, MD      . ondansetron (ZOFRAN-ODT) disintegrating tablet 4 mg  4 mg Oral Q6H PRN Craige Cotta, MD      . pantoprazole (PROTONIX) EC tablet 20 mg  20 mg Oral Daily Craige Cotta, MD   20 mg at 11/29/14 0744  . pneumococcal 23 valent vaccine (PNU-IMMUNE) injection 0.5 mL  0.5 mL Intramuscular Tomorrow-1000 Rachael Fee, MD   0.5 mL at 11/28/14 1000  . thiamine (VITAMIN B-1) tablet 100 mg  100 mg Oral Daily Craige Cotta, MD   100 mg at 11/29/14 0744  . traZODone (DESYREL) tablet 50 mg  50 mg Oral QHS PRN Worthy Flank, NP        Lab Results: No results found for this or any previous  visit (from the past 48 hour(s)).  Physical Findings: AIMS: Facial and Oral Movements Muscles of Facial Expression: None, normal Lips and Perioral Area: None, normal Jaw: None, normal Tongue: None, normal,Extremity Movements Upper (arms, wrists, hands, fingers): None, normal Lower (legs, knees, ankles, toes): None, normal,  Trunk Movements Neck, shoulders, hips: None, normal, Overall Severity Severity of abnormal movements (highest score from questions above): None, normal Incapacitation due to abnormal movements: None, normal Patient's awareness of abnormal movements (rate only patient's report): No Awareness, Dental Status Current problems with teeth and/or dentures?: Yes Does patient usually wear dentures?: No  CIWA:  CIWA-Ar Total: 5 COWS:     Treatment Plan Summary: Daily contact with patient to assess and evaluate symptoms and progress in treatment and Medication management Supportive approach/coping skills Alcohol cocaine dependence; continue Ativan Detox protocol/work a relapse prevention plan Depression: pursue the Prozac 20 mg daily further Use CBT/minfulness Pain; pursue the Mobic/Robaxin/neurontin  Involuntary muscle contractions right leg; will consult internal medicine Explore residential treatment options  Medical Decision Making:  Review of Psycho-Social Stressors (1), Review or order clinical lab tests (1), Review of Medication Regimen & Side Effects (2) and Review of New Medication or Change in Dosage (2)     Oddis Westling A 11/29/2014, 11:03 AM

## 2014-11-29 NOTE — Progress Notes (Signed)
D: Patient is visible in the milieu.  He has minimal interaction with staff and peers.  He rates his depression as a 6; hopelessness as a 7; anxiety as a five.  He has flat, blunted affect with irritable mood.  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  He reports withdrawal symptoms of diarrhea, cravings, cramping, nausea and irritability.  He denies SI/HI/AVH. A: Continue to monitor medication management and MD orders.  Safety checks completed every 15 minutes per protocol.  Offer support and encouragement as needed. R: Patient's behavior is appropriate to situation.

## 2014-11-29 NOTE — Plan of Care (Signed)
Problem: Alteration in mood & ability to function due to Goal: LTG-Pt verbalizes understanding of importance of med regimen (Patient verbalizes understanding of importance of medication regimen and need to continue outpatient care and support groups)  Outcome: Progressing Patient is knowledgeable about what medications he is taking and indications for each.

## 2014-11-29 NOTE — [facility] Group Notes (Signed)
[facility] LCSW Aftercare Discharge Planning Group Note  11/29/2014  8:45 AM  Participation Quality: Did Not Attend. Patient invited to participate but declined.  Samuella Bruin, MSW, [employer] Clinical Social Worker [facility] [phone]

## 2014-11-29 NOTE — Progress Notes (Signed)
Recreation Therapy Notes  Date: 08.15.2016 Time: 9:30am  Location: [address] Group Room   Group Topic: Stress Management  Goal Area(s) Addresses:  Patient will actively participate in stress management techniques presented during session.   Behavioral Response: Did not attend.   Tylerjames Hoglund L Majd Tissue, LRT/CTRS        Mills Mitton L 11/29/2014 3:03 PM 

## 2014-11-29 NOTE — [facility] Group Notes (Signed)
Adult Psychoeducational Group Note  Date:  11/29/2014 Time:  9:44 PM  Group Topic/Focus:  AA Meeting  Participation Level:  None  Participation Quality:  Attentive  Affect:  Flat  Cognitive:  Alert  Insight: None  Engagement in Group:  None  Modes of Intervention:  Discussion  Additional Comments:  Patient attended group and was attentive.  Caroll Rancher A 11/29/2014, 9:44 PM

## 2014-11-29 NOTE — Consult Note (Signed)
Consult Reason for Consult: gait instability Referring Physician: Dr Dub Mikes  CC: gait instability  HPI: Darren Graham is an 49 y.o. male hx of EtOH dependence admitted to behavioral health for EtOH withdrawal and detoxification. Reports drinking up to 400 oz of beer a day for "at least a few months". Also notes using cocaine infrequently.   Neurology consulted after patient noted by PT to have gait instability. He notes a history of severe arthritis in his right hip. Reports around one month ago he started having pain and spasms involving her RLE. This has caused difficulty walking. Denies any RUE or left sided weakness. No sensory, speech or visual deficits. Denies any prior stroke or TIA symptoms.   Past Medical History  Diagnosis Date  . Arthritis   . Fibromyalgia     History reviewed. No pertinent past surgical history.  History reviewed. No pertinent family history.  Social History:  reports that he has been smoking Cigarettes.  He has a 17.5 pack-year smoking history. He does not have any smokeless tobacco history on file. He reports that he drinks alcohol. He reports that he uses illicit drugs (Cocaine).  No Known Allergies  Medications:  Scheduled: . feeding supplement (ENSURE ENLIVE)  237 mL Oral BID BM  . FLUoxetine  20 mg Oral Daily  . gabapentin  200 mg Oral TID  . LORazepam  1 mg Oral BID   Followed by  . [START ON 12/01/2014] LORazepam  1 mg Oral Daily  . meloxicam  15 mg Oral Daily  . methocarbamol  500 mg Oral QID  . multivitamin with minerals  1 tablet Oral Daily  . pantoprazole  20 mg Oral Daily  . pneumococcal 23 valent vaccine  0.5 mL Intramuscular Tomorrow-1000  . thiamine  100 mg Oral Daily    ROS: Out of a complete 14 system review, the patient complains of only the following symptoms, and all other reviewed systems are negative. +gait instability  Physical Examination: Filed Vitals:   11/29/14 1159  BP: 104/73  Pulse: 74  Temp:   Resp: 18    Physical Exam  Constitutional: He appears well-developed and well-nourished.  Psych: Affect appropriate to situation Eyes: No scleral injection HENT: No OP obstrucion Head: Normocephalic.  Cardiovascular: Normal rate and regular rhythm.  Respiratory: Effort normal and breath sounds normal.  GI: Soft. Bowel sounds are normal. No distension. There is no tenderness.  Skin: WDI  Neurologic Examination Mental Status: Alert, oriented, thought content appropriate.  Speech fluent without evidence of aphasia.  Able to follow 3 step commands without difficulty. Cranial Nerves: II: optic discs not visualized, visual fields grossly normal, pupils equal, round, reactive to light and accommodation III,IV, VI: ptosis not present, extra-ocular motions intact bilaterally V,VII: smile symmetric, facial light touch sensation normal bilaterally VIII: hearing normal bilaterally IX,X: gag reflex present XI: trapezius strength/neck flexion strength normal bilaterally XII: tongue strength normal  Motor: Right : Upper extremity    Left:     Upper extremity 5/5 deltoid       5/5 deltoid 5/5 biceps      5/5 biceps  5/5 triceps      5/5 triceps 5/5 hand grip      5/5 hand grip  Lower extremity     Lower extremity 5/5 hip flexor      5/5 hip flexor 5/5 quadricep      5/5 quadriceps  5/5 hamstrings     5/5 hamstrings 5/5 plantar flexion  5/5 plantar flexion 5/5 plantar extension     5/5 plantar extension Tone and bulk:normal tone throughout; no atrophy noted Sensory: Pinprick and light touch intact throughout, bilaterally, decreased proprioception bilateral LE Deep Tendon Reflexes: 2+ bilateral biceps and brachioradialis, 3+ patellar bilateral with crossed adductor response, 1+ achilles bilateral Plantars: Right: mute   Left: mute Cerebellar: normal finger-to-nose,  and normal heel-to-shin test Gait: antalgic gait favoring RLE  Laboratory Studies:   Basic Metabolic Panel:  Recent  Labs Lab 11/27/14 0104  NA 137  K 3.7  CL 102  CO2 25  GLUCOSE 88  BUN 6  CREATININE 0.76  CALCIUM 9.2    Liver Function Tests:  Recent Labs Lab 11/27/14 0104  AST 41  ALT 26  ALKPHOS 56  BILITOT 0.4  PROT 7.8  ALBUMIN 4.3   No results for input(s): LIPASE, AMYLASE in the last 168 hours. No results for input(s): AMMONIA in the last 168 hours.  CBC:  Recent Labs Lab 11/27/14 0104  WBC 9.2  HGB 13.7  HCT 39.6  MCV 90.8  PLT 239    Cardiac Enzymes: No results for input(s): CKTOTAL, CKMB, CKMBINDEX, TROPONINI in the last 168 hours.  BNP: Invalid input(s): POCBNP  CBG: No results for input(s): GLUCAP in the last 168 hours.  Microbiology: No results found for this or any previous visit.  Coagulation Studies: No results for input(s): LABPROT, INR in the last 72 hours.  Urinalysis: No results for input(s): COLORURINE, LABSPEC, PHURINE, GLUCOSEU, HGBUR, BILIRUBINUR, KETONESUR, PROTEINUR, UROBILINOGEN, NITRITE, LEUKOCYTESUR in the last 168 hours.  Invalid input(s): APPERANCEUR  Lipid Panel:  No results found for: CHOL, TRIG, HDL, CHOLHDL, VLDL, LDLCALC  HgbA1C: No results found for: HGBA1C  Urine Drug Screen:     Component Value Date/Time   LABOPIA NONE DETECTED 11/27/2014 0104   COCAINSCRNUR NONE DETECTED 11/27/2014 0104   LABBENZ NONE DETECTED 11/27/2014 0104   AMPHETMU NONE DETECTED 11/27/2014 0104   THCU NONE DETECTED 11/27/2014 0104   LABBARB NONE DETECTED 11/27/2014 0104    Alcohol Level:  Recent Labs Lab 11/27/14 0104  ETH 131*    Imaging: No results found.   Assessment/Plan:  49y/o gentleman with hx of EtOH abuse admitted for EtOH detoxification. Reports a one month history of painful spasms involving his RLE which per description appear dystonic in nature. Exam pertinent for hyperreflexia in bilateral LE and decreased proprioception. Possible spasticity and hyperreflexia raises concern for a possible central process. A lumbar  radiculopathy is also in the differential.  -check B12, TSH -Check MRI brain, C and T spine -If above unremarkable would benefit from outpatient neurology follow up for EMG/NCS and possible lumbar spine imaging -Will follow up when imaging completed. Imaging can be completed as outpatient if patient otherwise medically stable for discharge   Elspeth Cho, DO [facility] [phone]  If 7pm- 7am, please page neurology on call as listed in [facility]. 11/29/2014, 4:19 PM

## 2014-11-29 NOTE — Clinical Social Work Note (Signed)
[facility] referral made at patient's request.  Darren Graham, MSW, [facility] Clinical Social Worker [facility] [phone]

## 2014-11-29 NOTE — [facility] Group Notes (Signed)
[facility] LCSW Group Therapy 11/29/2014  1:15 pm  Type of Therapy: Group Therapy Participation Level: Active  Participation Quality: Attentive, Sharing and Supportive  Affect: Depressed and Flat  Cognitive: Alert and Oriented  Insight: Developing/Improving and Engaged  Engagement in Therapy: Developing/Improving and Engaged  Modes of Intervention: Clarification, Confrontation, Discussion, Education, Exploration,  Limit-setting, Orientation, Problem-solving, Rapport Building, Dance movement psychotherapist, Socialization and Support  Summary of Progress/Problems: Pt identified obstacles faced currently and processed barriers involved in overcoming these obstacles. Pt identified steps necessary for overcoming these obstacles and explored motivation (internal and external) for facing these difficulties head on. Pt further identified one area of concern in their lives and chose a goal to focus on for today. Patient discussed his tendency to "isolate, shut down, be closed in" and verbalized his need to love himself more and accept help from others. CSW and other group members provided patient with emotional support and encouragement.  Samuella Bruin, MSW, [employer] Clinical Social Worker [facility] [phone]

## 2014-11-30 ENCOUNTER — Ambulatory Visit (HOSPITAL_COMMUNITY): Admit: 2014-11-30 | Payer: Self-pay

## 2014-11-30 ENCOUNTER — Inpatient Hospital Stay (HOSPITAL_COMMUNITY)
Admission: AD | Admit: 2014-11-30 | Discharge: 2014-11-30 | Disposition: A | Discharge status: Home / Self Care | Payer: Federal, State, Local not specified - Other | Source: Intra-hospital | Attending: Neurology | Admitting: Neurology

## 2014-11-30 ENCOUNTER — Ambulatory Visit (HOSPITAL_COMMUNITY)
Admit: 2014-11-30 | Discharge: 2014-11-30 | Disposition: A | Discharge status: Home / Self Care | Payer: Self-pay | Source: Ambulatory Visit | Attending: Neurology | Admitting: Neurology

## 2014-11-30 DIAGNOSIS — H539 Unspecified visual disturbance: Secondary | ICD-10-CM | POA: Insufficient documentation

## 2014-11-30 DIAGNOSIS — R269 Unspecified abnormalities of gait and mobility: Secondary | ICD-10-CM | POA: Insufficient documentation

## 2014-11-30 DIAGNOSIS — G9589 Other specified diseases of spinal cord: Secondary | ICD-10-CM | POA: Insufficient documentation

## 2014-11-30 DIAGNOSIS — M4804 Spinal stenosis, thoracic region: Secondary | ICD-10-CM | POA: Insufficient documentation

## 2014-11-30 DIAGNOSIS — N281 Cyst of kidney, acquired: Secondary | ICD-10-CM | POA: Insufficient documentation

## 2014-11-30 DIAGNOSIS — M4802 Spinal stenosis, cervical region: Secondary | ICD-10-CM | POA: Insufficient documentation

## 2014-11-30 DIAGNOSIS — M419 Scoliosis, unspecified: Secondary | ICD-10-CM | POA: Insufficient documentation

## 2014-11-30 DIAGNOSIS — R531 Weakness: Secondary | ICD-10-CM | POA: Insufficient documentation

## 2014-11-30 DIAGNOSIS — R202 Paresthesia of skin: Secondary | ICD-10-CM | POA: Insufficient documentation

## 2014-11-30 DIAGNOSIS — R410 Disorientation, unspecified: Secondary | ICD-10-CM | POA: Insufficient documentation

## 2014-11-30 DIAGNOSIS — R110 Nausea: Secondary | ICD-10-CM | POA: Insufficient documentation

## 2014-11-30 LAB — VITAMIN B12: Vitamin B-12: 344 pg/mL (ref 180–914)

## 2014-11-30 LAB — TSH: TSH: 1.479 u[IU]/mL (ref 0.350–4.500)

## 2014-11-30 MED ORDER — AMMONIUM LACTATE 12 % EX LOTN
TOPICAL_LOTION | CUTANEOUS | Status: DC | PRN
  Administered 2014-11-30: 11:00:00 via TOPICAL
  Filled 2014-11-30: qty 400

## 2014-11-30 NOTE — Progress Notes (Signed)
Recreation Therapy Notes  Animal-Assisted Activity (AAA) Program Checklist/Progress Notes Patient Eligibility Criteria Checklist & Daily Group note for Rec Tx Intervention  Date: 08.16.2016 Time: 2:45pm Location: [address]    AAA/T Program Assumption of Risk Form signed by Patient/ or Parent Legal Guardian yes  Patient is free of allergies or sever asthma yes  Patient reports no fear of animals yes  Patient reports no history of cruelty to animals yes  Patient understands his/her participation is voluntary yes  Behavioral Response: Did not attend.    Marykay Lex Adajah Cocking, LRT/CTRS        Jearl Klinefelter 11/30/2014 3:16 PM

## 2014-11-30 NOTE — Tx Team (Signed)
Interdisciplinary Treatment Plan Update (Adult) Date: 11/30/2014    Time Reviewed: 9:30 AM  Progress in Treatment: Attending groups: Continuing to assess, patient new to milieu Participating in groups: Continuing to assess, patient new to milieu Taking medication as prescribed: Yes Tolerating medication: Yes Family/Significant other contact made: No, CSW assessing for appropriate contacts Patient understands diagnosis: Yes Discussing patient identified problems/goals with staff: Yes Medical problems stabilized or resolved: Yes Denies suicidal/homicidal ideation: Yes Issues/concerns per patient self-inventory: Yes Other:  New problem(s) identified: N/A  Discharge Plan or Barriers: 11/30/2014:  Patient requesting referral to residential treatment  Reason for Continuation of Hospitalization:  Depression Anxiety Medication Stabilization   Comments: N/A  Estimated length of stay: 2-3 days   Darren Graham is a 49yo male who is hospitalized for detox due to daily alcohol, also uses crack cocaine 1-2 times a month, along with depression, suicidal ideation with a plan but no intent, paranoia. He is homeless, generally unable to work due to hip problems and walks with a walker. He has no supports, has been to multiple rehabs before and is asking for assistance with getting into [facility] in [place] or [facility] in [place], or [facility] in [place] as a last resort. He does not have transportation, asks if sheriff could take him. The patient would benefit from safety monitoring, medication evaluation, psychoeducation, group therapy, and discharge planning to link with ongoing resources.     Review of initial/current patient goals per problem list:  1. Goal(s): Patient will participate in aftercare plan   Met: No   Target date: 3-5 days post admission date   As evidenced by: Patient will participate within aftercare plan AEB aftercare provider and housing  plan at discharge being identified.  11/30/2014: Goal not met: CSW assessing for appropriate referrals for pt and will have follow up secured prior to d/c.    2. Goal (s): Patient will exhibit decreased depressive symptoms and suicidal ideations.   Met: No   Target date: 3-5 days post admission date   As evidenced by: Patient will utilize self rating of depression at 3 or below and demonstrate decreased signs of depression or be deemed stable for discharge by MD.  11/30/2014: Goal not met: Pt presents with flat affect and depressed mood.  Pt admitted with depression rating of 10.  Pt to show decreased sign of depression and a rating of 3 or less before d/c.       3. Goal(s): Patient will demonstrate decreased signs and symptoms of anxiety.   Met: No   Target date: 3-5 days post admission date   As evidenced by: Patient will utilize self rating of anxiety at 3 or below and demonstrated decreased signs of anxiety, or be deemed stable for discharge by MD  11/30/2014: Goal not met: Pt presents with anxious mood and affect.  Pt admitted with anxiety rating of 10.  Pt to show decreased sign of anxiety and a rating of 3 or less before d/c.    4. Goal(s): Patient will demonstrate decreased signs of withdrawal due to substance abuse   Met: Yes   Target date: 3-5 days post admission date   As evidenced by: Patient will produce a CIWA/COWS score of 0, have stable vitals signs, and no symptoms of withdrawal  11/30/2014: Goal met: No withdrawal symptoms reported at this time per medical chart.    Attendees: Patient:    Family:    Physician: Dr. Parke Poisson; Dr. Sabra Heck 11/30/2014 9:30 AM  Nursing:  Mayra Neer, Festus Aloe, Patrice Scherrie Gerlach Superior RN 11/30/2014 9:30 AM  Clinical Social Worker: Erasmo Downer Idan Prime,  [place] 11/30/2014 9:30 AM  Other: Louie Bun Smart, LCSWA 11/30/2014 9:30 AM  Other: Debarah Crape, [facility] 11/30/2014 9:30 AM  Other:  11/30/2014 9:30 AM   Other: Ave Filter, NP 11/30/2014 9:30 AM  Other:    Other:    Other:       Scribe for Treatment Team:  Tilden Fossa, MSW, [place] [phone]

## 2014-11-30 NOTE — Progress Notes (Signed)
[facility] Group Notes:  (Nursing/MHT/Case Management/Adjunct)  Date:  11/30/2014  Time:  2100 Type of Therapy:  wrap up group  Participation Level:  Active  Participation Quality:  Appropriate, Attentive, Sharing and Supportive  Affect:  Appropriate  Cognitive:  Appropriate  Insight:  Lacking  Engagement in Group:  Engaged  Modes of Intervention:  Clarification, Education and Support  Summary of Progress/Problems:  Pt shares that because of his drinking he has been in some rough, hard places and unable to care for himself. Pt wants to get to a better place and have a place of his own. When asked what patient feels with help him to stay away from drinking, pt reports his plan is to just "not pick it up, say no, walk away, or run if need be".    Shelah Lewandowsky 11/30/2014, 10:12 PM

## 2014-11-30 NOTE — [facility] Group Notes (Signed)
[facility] Group Notes:  (Nursing/MHT/Case Management/Adjunct)  Date:  11/30/2014  Time:  0900 am  Type of Therapy:  Psychoeducational Skills  Participation Level:  Did Not Attend  Patient invited; declined to attend.  Summary of Progress/Problems:  Darren Graham 11/30/2014, 9:56 AM

## 2014-11-30 NOTE — Progress Notes (Signed)
D: Patient had difficulty getting out of bed this morning.  He came up for his meds and went back to bed.  Patient rates his depression, anxiety and hopelessness as severe.  He denies SI/HI/AVH.  Patient is hoping to get into a long term treatment facility.  He denies any withdrawal symptoms.  Patient is scheduled for a MRI of spine, head and thoracic area today at 2:45 pm.   A: Continue to monitor medication management and MD orders.  Safety checks completed every 15 minutes per protocol.  Offer support and encouragement as needed. R: Patient's behavior is appropriate to situation.

## 2014-11-30 NOTE — Progress Notes (Signed)
[facility] MD Progress Note  11/30/2014 6:13 PM Racer Quam  MRN:  [id number] Subjective:  Jamani has continued to be detox. He is undergoing the evaluation for his back/leg pain involuntary muscle contraptions. He has not seen any changes. He states he really wants to get his life back together. He is hoping to be able to go to a residential treatment program Principal Problem: Alcohol use disorder, severe, dependence Diagnosis:   Patient Active Problem List   Diagnosis Date Noted  . Substance induced mood disorder [F19.94] 11/27/2014  . Alcohol use disorder, severe, dependence [F10.20] 11/27/2014  . Cocaine use disorder, moderate, dependence [F14.20] 11/27/2014   Total Time spent with patient: 20 minutes   Past Medical History:  Past Medical History  Diagnosis Date  . Arthritis   . Fibromyalgia    History reviewed. No pertinent past surgical history. Family History: History reviewed. No pertinent family history. Social History:  History  Alcohol Use  . Yes    Comment: 8-10 40 ounces daily     History  Drug Use  . Yes  . Special: Cocaine    Comment: $50-$60 sporadic    Social History   Social History  . Marital Status: Single    Spouse Name: N/A  . Number of Children: N/A  . Years of Education: N/A   Social History Main Topics  . Smoking status: Current Every Day Smoker -- 0.50 packs/day for 35 years    Types: Cigarettes  . Smokeless tobacco: None  . Alcohol Use: Yes     Comment: 8-10 40 ounces daily  . Drug Use: Yes    Special: Cocaine     Comment: $50-$60 sporadic  . Sexual Activity: Yes    Birth Control/ Protection: Condom   Other Topics Concern  . None   Social History Narrative   Additional History:    Sleep: Fair  Appetite:  Fair   Assessment:   Musculoskeletal: Strength & Muscle Tone: within normal limits Gait & Station: using walker Patient leans: Front   Psychiatric Specialty Exam: Physical Exam  Review of Systems  Constitutional:  Negative.   HENT: Negative.   Eyes: Negative.   Respiratory: Negative.   Cardiovascular: Negative.   Gastrointestinal: Negative.   Genitourinary: Negative.   Musculoskeletal: Positive for back pain and joint pain.       Right leg pain  Skin: Negative.   Neurological: Negative.   Endo/Heme/Allergies: Negative.   Psychiatric/Behavioral: Positive for depression and substance abuse. The patient is nervous/anxious.     Blood pressure 129/69, pulse 71, temperature 98.3 F (36.8 C), temperature source Oral, resp. rate 16, height 5\' 6"  (1.676 m), weight 69.4 kg (153 lb).Body mass index is 24.71 kg/(m^2).  General Appearance: Fairly Groomed  Patent attorney::  Fair  Speech:  Clear and Coherent and Slow  Volume:  Decreased  Mood:  Anxious, Depressed and worried  Affect:  anxious depressed worried  Thought Process:  Coherent and Goal Directed  Orientation:  Full (Time, Place, and Person)  Thought Content:  symptoms events worries concerns  Suicidal Thoughts:  No  Homicidal Thoughts:  No  Memory:  Immediate;   Fair Recent;   Fair Remote;   Fair  Judgement:  Fair  Insight:  Present and Shallow  Psychomotor Activity:  Restlessness  Concentration:  Fair  Recall:  [employer] of Knowledge:Fair  Language: Fair  Akathisia:  No  Handed:  Right  AIMS (if indicated):     Assets:  Desire for Improvement  ADL's:  Intact  Cognition: WNL  Sleep:  Number of Hours: 6     Current Medications: Current Facility-Administered Medications  Medication Dose Route Frequency Provider Last Rate Last Dose  . acetaminophen (TYLENOL) tablet 650 mg  650 mg Oral Q6H PRN Worthy Flank, NP      . alum & mag hydroxide-simeth (MAALOX/MYLANTA) 200-200-20 MG/5ML suspension 30 mL  30 mL Oral Q4H PRN Worthy Flank, NP      . ammonium lactate (LAC-HYDRIN) 12 % lotion   Topical PRN Kerry Hough, PA-C      . feeding supplement (ENSURE ENLIVE) (ENSURE ENLIVE) liquid 237 mL  237 mL Oral BID BM Rachael Fee, MD   237  mL at 11/30/14 1315  . FLUoxetine (PROZAC) capsule 20 mg  20 mg Oral Daily Craige Cotta, MD   20 mg at 11/30/14 7829  . gabapentin (NEURONTIN) capsule 200 mg  200 mg Oral TID Rachael Fee, MD   200 mg at 11/30/14 1811  . [START ON 12/01/2014] LORazepam (ATIVAN) tablet 1 mg  1 mg Oral Daily Fernando A Cobos, MD      . magnesium hydroxide (MILK OF MAGNESIA) suspension 30 mL  30 mL Oral Daily PRN Worthy Flank, NP      . meloxicam (MOBIC) tablet 15 mg  15 mg Oral Daily Worthy Flank, NP   15 mg at 11/30/14 0821  . methocarbamol (ROBAXIN) tablet 500 mg  500 mg Oral QID Sanjuana Kava, NP   500 mg at 11/30/14 1811  . multivitamin with minerals tablet 1 tablet  1 tablet Oral Daily Craige Cotta, MD   1 tablet at 11/30/14 5621  . nicotine polacrilex (NICORETTE) gum 2 mg  2 mg Oral PRN Rachael Fee, MD      . pantoprazole (PROTONIX) EC tablet 20 mg  20 mg Oral Daily Craige Cotta, MD   20 mg at 11/30/14 3086  . pneumococcal 23 valent vaccine (PNU-IMMUNE) injection 0.5 mL  0.5 mL Intramuscular Tomorrow-1000 Rachael Fee, MD   0.5 mL at 11/28/14 1000  . thiamine (VITAMIN B-1) tablet 100 mg  100 mg Oral Daily Craige Cotta, MD   100 mg at 11/30/14 5784  . traZODone (DESYREL) tablet 50 mg  50 mg Oral QHS PRN Worthy Flank, NP        Lab Results: No results found for this or any previous visit (from the past 48 hour(s)).  Physical Findings: AIMS: Facial and Oral Movements Muscles of Facial Expression: None, normal Lips and Perioral Area: None, normal Jaw: None, normal Tongue: None, normal,Extremity Movements Upper (arms, wrists, hands, fingers): None, normal Lower (legs, knees, ankles, toes): None, normal, Trunk Movements Neck, shoulders, hips: None, normal, Overall Severity Severity of abnormal movements (highest score from questions above): None, normal Incapacitation due to abnormal movements: None, normal Patient's awareness of abnormal movements (rate only patient's report):  No Awareness, Dental Status Current problems with teeth and/or dentures?: Yes Does patient usually wear dentures?: No  CIWA:  CIWA-Ar Total: 2 COWS:     Treatment Plan Summary: Daily contact with patient to assess and evaluate symptoms and progress in treatment and Medication management Supportive approach/coping skills Alcohol/cocaine dependence; continue the ativan detox protocol/work a relapse prevention plan Pain; pursue the workup as recommended by neuro Depression; continue the Prozac at 20 mg daily Follow the neuro recommendations  Medical Decision Making:  Review of Psycho-Social Stressors (1) and Review of Medication Regimen & Side  Effects (2)     Adonias Demore A 11/30/2014, 6:13 PM

## 2014-11-30 NOTE — Progress Notes (Signed)
D:  Pt informed the writer that he'll be "glad when they find out what's wrong with him". Stated that up to 2 weeks he was walking without much difficulty.   A:  Support and encouragement was offered. 15 min checks continued for safety.  R: Pt remains safe.

## 2014-11-30 NOTE — [facility] Group Notes (Signed)
[facility] LCSW Group Therapy  11/30/2014   1:15 PM   Type of Therapy:  Group Therapy  Participation Level:  Active  Participation Quality:  Attentive, Sharing and Supportive  Affect:  Depressed and Flat  Cognitive:  Alert and Oriented  Insight:  Developing/Improving and Engaged  Engagement in Therapy:  Developing/Improving and Engaged  Modes of Intervention:  Clarification, Confrontation, Discussion, Education, Exploration, Limit-setting, Orientation, Problem-solving, Rapport Building, Dance movement psychotherapist, Socialization and Support  Summary of Progress/Problems: The topic for group therapy was feelings about diagnosis.  Pt actively participated in group discussion on their past and current diagnosis and how they feel towards this.  Pt also identified how society and family members judge them, based on their diagnosis as well as stereotypes and stigmas. Patient discussed that the main priority in his life is alcohol. He reports that he is scared of hisself and his impulsiveness. He states that his family enabled him and that he alone is accountable for his use and destructive behavior. CSW and other group members provided patient with emotional support and encouragement.  Samuella Bruin, MSW, [employer] Clinical Social Worker [facility] [phone]

## 2014-12-01 DIAGNOSIS — R269 Unspecified abnormalities of gait and mobility: Secondary | ICD-10-CM

## 2014-12-01 NOTE — Consult Note (Signed)
CC:  Clumsiness in hands, difficulty walking.  HPI: Darren Graham is a 49 y.o. male who is admitted to the [facility] for substance abuse and ETOH detox.  He reports a >1 month history of clumsiness of his hands and gait instability.  He complains of tingling and numbness in his hands.  He denies weakness or bowel or bladder difficulties.  PMH: Past Medical History  Diagnosis Date  . Arthritis   . Fibromyalgia     PSH: History reviewed. No pertinent past surgical history.  SH: Social History  Substance Use Topics  . Smoking status: Current Every Day Smoker -- 0.50 packs/day for 35 years    Types: Cigarettes  . Smokeless tobacco: None  . Alcohol Use: Yes     Comment: 8-10 40 ounces daily    MEDS: Prior to Admission medications   Medication Sig Start Date End Date Taking? Authorizing Provider  acetaminophen (TYLENOL) 650 MG CR tablet Take 650 mg by mouth every 8 (eight) hours as needed for pain.    Historical Provider, MD  meloxicam (MOBIC) 15 MG tablet Take 15 mg by mouth daily.    Historical Provider, MD    ALLERGY: No Known Allergies  ROS: ROS  History of substance abuse (ETOH).  Denies headache or neck pain.  No chest pain.  No shortness of breath.  No abdominal pain, nausea, or vomiting.  Complains of right hip pain.  No skin lesions.  No fevers or chills.  NEUROLOGIC EXAM: Awake, alert, oriented Memory and concentration grossly intact Speech fluent, appropriate CN grossly intact Motor exam: Upper Extremities Deltoid Bicep Tricep Grip  Right 5/5 5/5 5/5 4/5  Left 5/5 5/5 5/5 4/5   Lower Extremity IP Quad PF DF EHL  Right 5/5 5/5 5/5 5/5 5/5  Left 5/5 5/5 5/5 5/5 5/5   Sensation grossly intact to LT Hoffman's + bilaterally, patellar reflex 3+ bilaterally, otherwise reflexes normal. He ambulates with a spastic gait.  IMAGING: I have independently reviewed his MRI Brain, C- and T-Spine dated 11/30/14.  His brain and thoracic spine  are essentially normal.  He has spondylotic changes in his cervical spine with severe spinal stenosis and spinal cord compression at C3-4 and C4-5.  He has myelomalacia at these levels.  IMPRESSION: - 49 y.o. male with cervical spondylosis with myelopathy.  He has deficits as detailed above.  I had a long discussion with him about his condition and the anticipated natural history which ranges anywhere from stable neurological loss as it stands now to progressive neurological loss which may result in an inability to ambulate and care for himself.  I have recommended a C3-4 and C4-5 ACDF with plate fixation.  I have described the risks and benefits of this procedure and the alternatives to the procedure in words that he understands.  He asked appropriate questions and I answered them.  He wishes to proceed with surgery.  PLAN: - Please transfer to the hospitalist service at [facility] main on Sunday evening.  I will schedule for surgery Monday morning.

## 2014-12-01 NOTE — Progress Notes (Signed)
Pt attended NA group this evening.  

## 2014-12-01 NOTE — Clinical Social Work Note (Signed)
CSW left voicemail for [facility] [employer] [facility] [employer].  Samuella Bruin, MSW, [employer] Clinical Social Worker [facility] [phone]

## 2014-12-01 NOTE — [facility] Group Notes (Signed)
   [facility] LCSW Aftercare Discharge Planning Group Note  12/01/2014  8:45 AM   Participation Quality: Alert, Appropriate and Oriented  Mood/Affect: Appropriate  Depression Rating: 3-4  Anxiety Rating: 3-4  Thoughts of Suicide: Pt denies SI/HI  Will you contract for safety? Yes  Current AVH: Pt denies  Plan for Discharge/Comments: Pt attended discharge planning group and actively participated in group. CSW provided pt with today's workbook. Patient reports feeling "alright" today and reports that he slept well last night. Patient is hopeful to get into [facility] or [employer] in [place].   Transportation Means: Pt reports access to transportation  Supports: No supports mentioned at this time  Samuella Bruin, MSW, [employer] Clinical Social Worker [employer] [phone]

## 2014-12-01 NOTE — Progress Notes (Signed)
D: Patient has brighter affect today.  States that his depressive symptoms have decreased.  Patient went for MRIs yesterday and it was determined that he will need surgery.  A neurologist will be following him.  He denies SI/HI/AVH.  Patient is interacting well with others and is attending groups. A: Continue to monitor medication management and MD orders.  Safety checks completed every 15 minutes per protocol.  Offer support and encouragement as needed. R: Patient's behavior is appropriate to situation.

## 2014-12-01 NOTE — [facility] Group Notes (Signed)
[facility] LCSW Group Therapy 12/01/2014  1:15 PM Type of Therapy: Group Therapy Participation Level: Active  Participation Quality: Attentive, Sharing and Supportive  Affect: Appropriate  Cognitive: Alert and Oriented  Insight: Developing/Improving and Engaged  Engagement in Therapy: Developing/Improving and Engaged  Modes of Intervention: Clarification, Confrontation, Discussion, Education, Exploration, Limit-setting, Orientation, Problem-solving, Rapport Building, Reality Testing, Socialization and Support  Summary of Progress/Problems: The topic for group today was emotional regulation. This group focused on both positive and negative emotion identification and allowed group members to process ways to identify feelings, regulate negative emotions, and find healthy ways to manage internal/external emotions. Group members were asked to reflect on a time when their reaction to an emotion led to a negative outcome and explored how alternative responses using emotion regulation would have benefited them. Group members were also asked to discuss a time when emotion regulation was utilized when a negative emotion was experienced. Patient participated in discussion of emotional stressors, warning signs, and coping strategies. CSW reviewed cognitive triangle concept with group and patient participated in working through various scenarios.   Darren Graham, MSW, LCSWA Clinical Social Worker [facility] [phone]        

## 2014-12-01 NOTE — Progress Notes (Signed)
D: Patient in the dayroom on approach.  Patient states he had a good day.  Patient states he has been worrying about his upcoming surgery on Monday.  Patient states he was told the risks and benefits and it scared him.  Patient states he is also sad because he has no family support and he will have to recover from surgery alone.  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  Patient states, "I am going to try to think positive."  Patient denies SI/HI and denies AVH. A: Staff to monitor Q 15 mins for safety.  Encouragement and support offered.  Scheduled medications administered per orders. R: Patient remains safe on the unit.  Patient attended group tonight.  Patient visible on the unit and interacting with peers.  Patient taking administered medications.

## 2014-12-01 NOTE — Progress Notes (Signed)
D: Pt informed the writer that a chest xray was performed today. Stated that he "gave blood", this morning when he woke up. Stated however, that we got more blood. Stated, "It got ,e a little nervous because they had already drawn 4 tubes before that".  A:  Support and encouragement was offered. 15 min checks continued for safety.  R: Pt remains safe.

## 2014-12-01 NOTE — Progress Notes (Signed)
Recreation Therapy Notes  Date: 08.17.2016 Time: 9:30am Location: [address] Group room   Group Topic: Stress Management  Goal Area(s) Addresses:  Patient will actively participate in stress management techniques presented during session.   Behavioral Response: Did not attend.   Arin Peral L Amanee Iacovelli, LRT/CTRS        Mehek Grega L 12/01/2014 3:03 PM 

## 2014-12-01 NOTE — Progress Notes (Signed)
[facility] MD Progress Note  12/01/2014 5:21 PM Kay Ricciuti  MRN:  [id number] Subjective:  Darren Graham was told about his need for neurosurgery. He was upset worried but he realized that it has to be done and that he needs to go day by day. He is looking ahead of where he will go after the surgery  Principal Problem: Alcohol use disorder, severe, dependence Diagnosis:   Patient Active Problem List   Diagnosis Date Noted  . Substance induced mood disorder [F19.94] 11/27/2014  . Alcohol use disorder, severe, dependence [F10.20] 11/27/2014  . Cocaine use disorder, moderate, dependence [F14.20] 11/27/2014   Total Time spent with patient: 30 minutes   Past Medical History:  Past Medical History  Diagnosis Date  . Arthritis   . Fibromyalgia    History reviewed. No pertinent past surgical history. Family History: History reviewed. No pertinent family history. Social History:  History  Alcohol Use  . Yes    Comment: 8-10 40 ounces daily     History  Drug Use  . Yes  . Special: Cocaine    Comment: $50-$60 sporadic    Social History   Social History  . Marital Status: Single    Spouse Name: N/A  . Number of Children: N/A  . Years of Education: N/A   Social History Main Topics  . Smoking status: Current Every Day Smoker -- 0.50 packs/day for 35 years    Types: Cigarettes  . Smokeless tobacco: None  . Alcohol Use: Yes     Comment: 8-10 40 ounces daily  . Drug Use: Yes    Special: Cocaine     Comment: $50-$60 sporadic  . Sexual Activity: Yes    Birth Control/ Protection: Condom   Other Topics Concern  . None   Social History Narrative   Additional History:    Sleep: Fair  Appetite:  Fair   Assessment:   Musculoskeletal: Strength & Muscle Tone: within normal limits Gait & Station: using a walker Patient leans: Front   Psychiatric Specialty Exam: Physical Exam  Review of Systems  Constitutional: Negative.   HENT: Negative.   Eyes: Negative.   Respiratory:  Negative.   Cardiovascular: Negative.   Gastrointestinal: Negative.   Genitourinary: Negative.   Musculoskeletal: Positive for back pain and joint pain.       Leg pain  Skin: Negative.   Neurological: Negative.   Endo/Heme/Allergies: Negative.   Psychiatric/Behavioral: Positive for substance abuse. The patient is nervous/anxious.     Blood pressure 102/77, pulse 91, temperature 98.4 F (36.9 C), temperature source Oral, resp. rate 20, height 5\' 6"  (1.676 m), weight 69.4 kg (153 lb).Body mass index is 24.71 kg/(m^2).  General Appearance: Fairly Groomed  Patent attorney::  Fair  Speech:  Clear and Coherent  Volume:  Normal  Mood:  Anxious and Depressed  Affect:  Restricted  Thought Process:  Coherent and Goal Directed  Orientation:  Full (Time, Place, and Person)  Thought Content:  symptoms events worries concerns  Suicidal Thoughts:  No  Homicidal Thoughts:  No  Memory:  Immediate;   Fair Recent;   Fair Remote;   Fair  Judgement:  Fair  Insight:  Present  Psychomotor Activity:  Restlessness  Concentration:  Fair  Recall:  [employer] of Knowledge:Fair  Language: Fair  Akathisia:  No  Handed:  Right  AIMS (if indicated):     Assets:  Desire for Improvement  ADL's:  Intact  Cognition: WNL  Sleep:  Number of Hours: 6  Current Medications: Current Facility-Administered Medications  Medication Dose Route Frequency Provider Last Rate Last Dose  . acetaminophen (TYLENOL) tablet 650 mg  650 mg Oral Q6H PRN Worthy Flank, NP      . alum & mag hydroxide-simeth (MAALOX/MYLANTA) 200-200-20 MG/5ML suspension 30 mL  30 mL Oral Q4H PRN Worthy Flank, NP      . ammonium lactate (LAC-HYDRIN) 12 % lotion   Topical PRN Kerry Hough, PA-C      . feeding supplement (ENSURE ENLIVE) (ENSURE ENLIVE) liquid 237 mL  237 mL Oral BID BM Rachael Fee, MD   237 mL at 12/01/14 1451  . FLUoxetine (PROZAC) capsule 20 mg  20 mg Oral Daily Craige Cotta, MD   20 mg at 12/01/14 0951  .  gabapentin (NEURONTIN) capsule 200 mg  200 mg Oral TID Rachael Fee, MD   200 mg at 12/01/14 1645  . magnesium hydroxide (MILK OF MAGNESIA) suspension 30 mL  30 mL Oral Daily PRN Worthy Flank, NP      . meloxicam (MOBIC) tablet 15 mg  15 mg Oral Daily Worthy Flank, NP   15 mg at 12/01/14 0951  . methocarbamol (ROBAXIN) tablet 500 mg  500 mg Oral QID Sanjuana Kava, NP   500 mg at 12/01/14 1645  . multivitamin with minerals tablet 1 tablet  1 tablet Oral Daily Craige Cotta, MD   1 tablet at 12/01/14 [phone]  . nicotine polacrilex (NICORETTE) gum 2 mg  2 mg Oral PRN Rachael Fee, MD      . pantoprazole (PROTONIX) EC tablet 20 mg  20 mg Oral Daily Craige Cotta, MD   20 mg at 12/01/14 9604  . pneumococcal 23 valent vaccine (PNU-IMMUNE) injection 0.5 mL  0.5 mL Intramuscular Tomorrow-1000 Rachael Fee, MD   0.5 mL at 11/28/14 1000  . thiamine (VITAMIN B-1) tablet 100 mg  100 mg Oral Daily Craige Cotta, MD   100 mg at 12/01/14 5409  . traZODone (DESYREL) tablet 50 mg  50 mg Oral QHS PRN Worthy Flank, NP        Lab Results:  Results for orders placed or performed during the hospital encounter of 11/27/14 (from the past 48 hour(s))  Vitamin B12     Status: None   Collection Time: 11/30/14  7:40 PM  Result Value Ref Range   Vitamin B-12 344 180 - 914 pg/mL    Comment: (NOTE) This assay is not validated for testing neonatal or myeloproliferative syndrome specimens for Vitamin B12 levels. Performed at [facility]   TSH     Status: None   Collection Time: 11/30/14  7:40 PM  Result Value Ref Range   TSH 1.479 0.350 - 4.500 uIU/mL    Comment: Performed at [facility]    Physical Findings: AIMS: Facial and Oral Movements Muscles of Facial Expression: None, normal Lips and Perioral Area: None, normal Jaw: None, normal Tongue: None, normal,Extremity Movements Upper (arms, wrists, hands, fingers): None, normal Lower (legs, knees, ankles, toes): None,  normal, Trunk Movements Neck, shoulders, hips: None, normal, Overall Severity Severity of abnormal movements (highest score from questions above): None, normal Incapacitation due to abnormal movements: None, normal Patient's awareness of abnormal movements (rate only patient's report): No Awareness, Dental Status Current problems with teeth and/or dentures?: Yes Does patient usually wear dentures?: No  CIWA:  CIWA-Ar Total: 0 COWS:     Treatment Plan Summary: Daily contact  with patient to assess and evaluate symptoms and progress in treatment and Medication management Supportive approach/coping skills Alcohol cocaine dependence; continue to work a relapse prevention plan Depression; continue to work with the Prozac 20 mg Facilitate transfer to the hospitalist service Sunday for surgery next week CBT/minfulness Medical Decision Making:  Review of Psycho-Social Stressors (1), Discuss test with performing physician (1) and Review of Medication Regimen & Side Effects (2)     Georgia Delsignore A 12/01/2014, 5:21 PM
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
## 2014-12-01 NOTE — Plan of Care (Signed)
Problem: Alteration in mood; excessive anxiety as evidenced by: Goal: LTG-Patient's behavior demonstrates decreased anxiety (Patient's behavior demonstrates anxiety and he/she is utilizing learned coping skills to deal with anxiety-producing situations)  Outcome: Progressing Patient denies Anxiety.  Patient sitting in the dayroom calmly interacting with peers.  Problem: Alteration in mood Goal: LTG-Patient reports reduction in suicidal thoughts (Patient reports reduction in suicidal thoughts and is able to verbalize a safety plan for whenever patient is feeling suicidal)  Outcome: Progressing Patient denies SI.  Patient verbally contracts for safety. Goal: STG-Patient reports thoughts of self-harm to staff Outcome: Progressing Patient states he verbally contracts for safety and he would talk to a staff member if her has those thoughts.

## 2014-12-02 ENCOUNTER — Other Ambulatory Visit: Payer: Self-pay | Admitting: Neurological Surgery

## 2014-12-02 NOTE — Tx Team (Signed)
Interdisciplinary Treatment Plan Update (Adult) Date: 12/02/2014    Time Reviewed: 9:30 AM  Progress in Treatment: Attending groups: Yes Participating in groups: Yes Taking medication as prescribed: Yes Tolerating medication: Yes Family/Significant other contact made: No, patient has declined for collateral contacts Patient understands diagnosis: Yes Discussing patient identified problems/goals with staff: Yes Medical problems stabilized or resolved: Yes Denies suicidal/homicidal ideation: Yes Issues/concerns per patient self-inventory: Yes Other:  New problem(s) identified: N/A  Discharge Plan or Barriers: 8/16:  Patient requesting referral to residential treatment 8/18: Patient reports that he will be transferred to medical unit for back surgery at d/c.  Reason for Continuation of Hospitalization:  Depression Anxiety Medication Stabilization   Comments: N/A  Estimated length of stay: 2-3 days   Darren Graham is a 49yo male who is hospitalized for detox due to daily alcohol, also uses crack cocaine 1-2 times a month, along with depression, suicidal ideation with a plan but no intent, paranoia. He is homeless, generally unable to work due to hip problems and walks with a walker. He has no supports, has been to multiple rehabs before and is asking for assistance with getting into [facility] in [place] or [facility] in [place], or [facility] in [place] as a last resort. He does not have transportation, asks if sheriff could take him. The patient would benefit from safety monitoring, medication evaluation, psychoeducation, group therapy, and discharge planning to link with ongoing resources.     Review of initial/current patient goals per problem list:  1. Goal(s): Patient will participate in aftercare plan   Met: Yes   Target date: 3-5 days post admission date   As evidenced by: Patient will participate within aftercare plan AEB aftercare provider  and housing plan at discharge being identified.  8/16: Goal not met: CSW assessing for appropriate referrals for pt and will have follow up secured prior to d/c. 8/18: Goal met: Patient reports that he will be transferred to the medical unit for back surgery at d/c.    2. Goal (s): Patient will exhibit decreased depressive symptoms and suicidal ideations.   Met: Goal met   Target date: 3-5 days post admission date   As evidenced by: Patient will utilize self rating of depression at 3 or below and demonstrate decreased signs of depression or be deemed stable for discharge by MD.  8/16: Goal not met: Pt presents with flat affect and depressed mood.  Pt admitted with depression rating of 10.  Pt to show decreased sign of depression and a rating of 3 or less before d/c.  8/17: Goal met: Patient rates depression at 3-4 today and denies SI.      3. Goal(s): Patient will demonstrate decreased signs and symptoms of anxiety.   Met: Yes   Target date: 3-5 days post admission date   As evidenced by: Patient will utilize self rating of anxiety at 3 or below and demonstrated decreased signs of anxiety, or be deemed stable for discharge by MD  8/16: Goal not met: Pt presents with anxious mood and affect.  Pt admitted with anxiety rating of 10.  Pt to show decreased sign of anxiety and a rating of 3 or less before d/c. 8/17: Goal met: Patient rates his anxiety at 3-4 today.    4. Goal(s): Patient will demonstrate decreased signs of withdrawal due to substance abuse   Met: Yes   Target date: 3-5 days post admission date   As evidenced by: Patient will produce a CIWA/COWS score  of 0, have stable vitals signs, and no symptoms of withdrawal  8/16: Goal met: No withdrawal symptoms reported at this time per medical chart.    Attendees: Patient:    Family:    Physician: Dr. Parke Poisson; Dr. Sabra Heck 12/02/2014 9:30 AM  Nursing: Mayra Neer, Patty Duke, Fredda Hammed [facility] 12/02/2014 9:30 AM  Clinical Social Worker: Tilden Fossa,  [place] 12/02/2014 9:30 AM  Other: Peri Maris, [place], LCSWA 12/02/2014 9:30 AM  Other:  12/02/2014 9:30 AM  Other:  12/02/2014 9:30 AM  Other: Samuel Jester, NP 12/02/2014 9:30 AM  Other:    Other:    Other:      Scribe for Treatment Team:  Tilden Fossa, MSW, [place] [phone]

## 2014-12-02 NOTE — Progress Notes (Signed)
Did not attend group 

## 2014-12-02 NOTE — [facility] Group Notes (Signed)
[facility] Group Notes:  (Nursing/MHT/Case Management/Adjunct)  Date:  12/02/2014  Time:  12:32 PM  Type of Therapy:  Nurse Education / Leisure Activities : The group is focused on teaching patients how  beneficial incorporating leisure activities into our lives can  Be for  Effective recovery.  Participation Level:  Minimal  Participation Quality:  Drowsy  Affect:  Blunted  Cognitive:  Alert  Insight:  Limited  Engagement in Group:  Engaged  Modes of Intervention:  Discussion  Summary of Progress/Problems:  Darren Graham 12/02/2014, 12:32 PM

## 2014-12-02 NOTE — Progress Notes (Signed)
D: Patient visible in the milieu.  Patient expressed concern over his upcoming cervical surgery on Monday.  Patient is to be discharged from [facility] and transferred to the hospitalist at [facility].  He denies any SI/HI/AVH.  He rates his depression as a 4; hopelessness as a 3; and anxiety as a 2.  He is sleeping and eating well.  He denies any withdrawal symptoms and has completed detox protocol.   A: Continue to monitor medication management and MD orders.  Safety checks completed every 15 minutes per protocol.  Encourage and support patient as needed. R: Patient's behavior is appropriate to situation.

## 2014-12-02 NOTE — Progress Notes (Signed)
[facility] MD Progress Note  12/02/2014 5:32 PM Darren Graham  MRN:  [id number] Subjective:  Continues to get prepared for neurosurgery on Monday. He states that once he goes trough the surgery he wants to pursue further work on his addiction. He is working on remaining optimistic about the whole thing Principal Problem: Alcohol use disorder, severe, dependence Diagnosis:   Patient Active Problem List   Diagnosis Date Noted  . Substance induced mood disorder [F19.94] 11/27/2014  . Alcohol use disorder, severe, dependence [F10.20] 11/27/2014  . Cocaine use disorder, moderate, dependence [F14.20] 11/27/2014   Total Time spent with patient: 15 minutes   Past Medical History:  Past Medical History  Diagnosis Date  . Arthritis   . Fibromyalgia    History reviewed. No pertinent past surgical history. Family History: History reviewed. No pertinent family history. Social History:  History  Alcohol Use  . Yes    Comment: 8-10 40 ounces daily     History  Drug Use  . Yes  . Special: Cocaine    Comment: $50-$60 sporadic    Social History   Social History  . Marital Status: Single    Spouse Name: N/A  . Number of Children: N/A  . Years of Education: N/A   Social History Main Topics  . Smoking status: Current Every Day Smoker -- 0.50 packs/day for 35 years    Types: Cigarettes  . Smokeless tobacco: None  . Alcohol Use: Yes     Comment: 8-10 40 ounces daily  . Drug Use: Yes    Special: Cocaine     Comment: $50-$60 sporadic  . Sexual Activity: Yes    Birth Control/ Protection: Condom   Other Topics Concern  . None   Social History Narrative   Additional History:    Sleep: Fair  Appetite:  Fair   Assessment:   Musculoskeletal: Strength & Muscle Tone: within normal limits Gait & Station: uses a walker  Patient leans: normal   Psychiatric Specialty Exam: Physical Exam  Review of Systems  Constitutional: Negative.   Eyes: Negative.   Respiratory: Negative.    Cardiovascular: Negative.   Gastrointestinal: Negative.   Genitourinary: Negative.   Musculoskeletal: Positive for back pain, joint pain and neck pain.  Skin: Negative.   Neurological: Negative.   Endo/Heme/Allergies: Negative.   Psychiatric/Behavioral: Positive for substance abuse. The patient is nervous/anxious.     Blood pressure 123/73, pulse 76, temperature 98.4 F (36.9 C), temperature source Oral, resp. rate 16, height 5\' 6"  (1.676 m), weight 69.4 kg (153 lb).Body mass index is 24.71 kg/(m^2).  General Appearance: Fairly Groomed  Patent attorney::  Fair  Speech:  Clear and Coherent  Volume:  Decreased  Mood:  Anxious and worried  Affect:  worried  Thought Process:  Coherent and Goal Directed  Orientation:  Full (Time, Place, and Person)  Thought Content:  symptoms events worries concerns  Suicidal Thoughts:  No  Homicidal Thoughts:  No  Memory:  Immediate;   Fair Recent;   Fair Remote;   Fair  Judgement:  Fair  Insight:  Present  Psychomotor Activity:  Restlessness  Concentration:  Fair  Recall:  [employer] of Knowledge:Fair  Language: Fair  Akathisia:  No  Handed:  Right  AIMS (if indicated):     Assets:  Desire for Improvement  ADL's:  Intact  Cognition: WNL  Sleep:  Number of Hours: 6     Current Medications: Current Facility-Administered Medications  Medication Dose Route Frequency Provider Last Rate Last  Dose  . acetaminophen (TYLENOL) tablet 650 mg  650 mg Oral Q6H PRN Worthy Flank, NP      . alum & mag hydroxide-simeth (MAALOX/MYLANTA) 200-200-20 MG/5ML suspension 30 mL  30 mL Oral Q4H PRN Worthy Flank, NP      . ammonium lactate (LAC-HYDRIN) 12 % lotion   Topical PRN Kerry Hough, PA-C      . feeding supplement (ENSURE ENLIVE) (ENSURE ENLIVE) liquid 237 mL  237 mL Oral BID BM Rachael Fee, MD   237 mL at 12/02/14 1639  . FLUoxetine (PROZAC) capsule 20 mg  20 mg Oral Daily Craige Cotta, MD   20 mg at 12/02/14 0751  . gabapentin (NEURONTIN)  capsule 200 mg  200 mg Oral TID Rachael Fee, MD   200 mg at 12/02/14 1639  . magnesium hydroxide (MILK OF MAGNESIA) suspension 30 mL  30 mL Oral Daily PRN Worthy Flank, NP      . meloxicam (MOBIC) tablet 15 mg  15 mg Oral Daily Worthy Flank, NP   15 mg at 12/02/14 0751  . methocarbamol (ROBAXIN) tablet 500 mg  500 mg Oral QID Sanjuana Kava, NP   500 mg at 12/02/14 1639  . multivitamin with minerals tablet 1 tablet  1 tablet Oral Daily Craige Cotta, MD   1 tablet at 12/02/14 0751  . nicotine polacrilex (NICORETTE) gum 2 mg  2 mg Oral PRN Rachael Fee, MD      . pantoprazole (PROTONIX) EC tablet 20 mg  20 mg Oral Daily Craige Cotta, MD   20 mg at 12/02/14 0751  . pneumococcal 23 valent vaccine (PNU-IMMUNE) injection 0.5 mL  0.5 mL Intramuscular Tomorrow-1000 Rachael Fee, MD   0.5 mL at 11/28/14 1000  . thiamine (VITAMIN B-1) tablet 100 mg  100 mg Oral Daily Craige Cotta, MD   100 mg at 12/02/14 0751  . traZODone (DESYREL) tablet 50 mg  50 mg Oral QHS PRN Worthy Flank, NP        Lab Results:  Results for orders placed or performed during the hospital encounter of 11/27/14 (from the past 48 hour(s))  Vitamin B12     Status: None   Collection Time: 11/30/14  7:40 PM  Result Value Ref Range   Vitamin B-12 344 180 - 914 pg/mL    Comment: (NOTE) This assay is not validated for testing neonatal or myeloproliferative syndrome specimens for Vitamin B12 levels. Performed at [facility]   TSH     Status: None   Collection Time: 11/30/14  7:40 PM  Result Value Ref Range   TSH 1.479 0.350 - 4.500 uIU/mL    Comment: Performed at [facility]    Physical Findings: AIMS: Facial and Oral Movements Muscles of Facial Expression: None, normal Lips and Perioral Area: None, normal Jaw: None, normal Tongue: None, normal,Extremity Movements Upper (arms, wrists, hands, fingers): None, normal Lower (legs, knees, ankles, toes): None, normal, Trunk  Movements Neck, shoulders, hips: None, normal, Overall Severity Severity of abnormal movements (highest score from questions above): None, normal Incapacitation due to abnormal movements: None, normal Patient's awareness of abnormal movements (rate only patient's report): No Awareness, Dental Status Current problems with teeth and/or dentures?: Yes Does patient usually wear dentures?: No  CIWA:  CIWA-Ar Total: 0 COWS:     Treatment Plan Summary: Daily contact with patient to assess and evaluate symptoms and progress in treatment and Medication management  Supportive approach/coping skills Alcohol cocaine dependence; continue to work a relapse prevention plan Continue to assess and address the co morbities Cord compression; will facilitate transfer to medicine for surgery on Monday CBT/mindfulness  Medical Decision Making:  Review of Psycho-Social Stressors (1) and Review of Medication Regimen & Side Effects (2)     Darren Graham A 12/02/2014, 5:32 PM

## 2014-12-02 NOTE — [facility] Group Notes (Signed)
[facility] LCSW Group Therapy 12/02/2014 1:15 PM Type of Therapy: Group Therapy Participation Level: Active  Participation Quality: Attentive, Sharing and Supportive  Affect: Appropriate  Cognitive: Alert and Oriented  Insight: Developing/Improving and Engaged  Engagement in Therapy: Developing/Improving and Engaged  Modes of Intervention: Activity, Clarification, Confrontation, Discussion, Education, Exploration, Limit-setting, Orientation, Problem-solving, Rapport Building, Reality Testing, Socialization and Support  Summary of Progress/Problems: Patient was attentive and engaged with speaker from [employer]. Patient was attentive to speaker while they shared their story of dealing with mental health and overcoming it. Patient expressed interest in their programs and services and received information on their agency. Patient processed ways they can relate to the speaker.   Naomia Lenderman, MSW, LCSWA Clinical Social Worker [facility] [phone]   

## 2014-12-03 MED ORDER — FLUOXETINE HCL 20 MG PO CAPS
30.0000 mg | ORAL_CAPSULE | Freq: Every day | ORAL | Status: DC
  Administered 2014-12-04 – 2014-12-05 (×2): 30 mg via ORAL
  Filled 2014-12-03 (×4): qty 1

## 2014-12-03 NOTE — Progress Notes (Signed)
  [facility] An Adult Case Management Discharge Plan :  Will you be returning to the same living situation after discharge:  No.Pt will be returning to hospital for back surgery on Sunday.  At discharge, do you have transportation home?: Yes,  hospital transport to [facility] for back surgery. on SUNDAY Do you have the ability to pay for your medications: Yes,  mental health  Release of information consent forms completed and submitted to Medical Records by CSW.   Patient to Follow up at: Follow-up Information    Follow up with [facility].   Specialty:  Behavioral Health   Why:  Walk-in clinic Monday-Friday between 8am to 3pm for assessment for therapy and medication management services.    Contact information:   [address] [place] [address] [phone]       Patient denies SI/HI: Yes,  during self report    Safety Planning and Suicide Prevention discussed: Yes,  SPE completed with pt. SPI pamphlet provided to pt and he was encouraged to share this information with support network.  Have you used any form of tobacco in the last 30 days? (Cigarettes, Smokeless Tobacco, Cigars, and/or Pipes): Yes  Has patient been referred to the Quitline?: Patient refused referral  Smart, Lebron Quam 12/03/2014, 10:31 AM

## 2014-12-03 NOTE — [facility] Group Notes (Signed)
[facility] LCSW Aftercare Discharge Planning Group Note   12/03/2014 10:39 AM  Participation Quality:  Appropriate   Mood/Affect:  Appropriate  Depression Rating:  0  Anxiety Rating:  2  Thoughts of Suicide:  No Will you contract for safety?   NA  Current AVH:  No  Plan for Discharge/Comments:  Pt reports that he is going in for back surgery on Sunday. He plans to follow up at [facility] afterward. No withdrawals and fair sleep reported.   Transportation Means: hospital transport   Supports: none identified.   Smart, [employer]

## 2014-12-03 NOTE — Progress Notes (Signed)
Adult Psychoeducational Group Note  Date:  12/03/2014 Time:  4:42 PM  Group Topic/Focus:  Relapse Prevention Planning:   The focus of this group is to define relapse and discuss the need for planning to combat relapse.  Participation Level:  Minimal  Participation Quality:  Appropriate and Sharing  Affect:  Appropriate  Cognitive:  Appropriate  Insight: Appropriate  Engagement in Group:  Developing/Improving  Modes of Intervention:  Discussion  Additional Comments:Patient attended the group this morning.  Patient states he enjoys drinking with his friends but he doesn't like the way he acts after drinking.  Patient has a hard time removing himself from that environment.   Nadeen Shipman R Kysean Sweet 12/03/2014, 4:42 PM

## 2014-12-03 NOTE — Progress Notes (Signed)
[facility] MD Progress Note  12/03/2014 3:55 PM Darren Graham  MRN:  [id number] Subjective:  Darren Graham admits he is feeling down. He was not counting on having to have surgery. He is worried about his substance use if he would be able to go to a residential treatment program after the surgery etc. Has no family involvement. States he is alone dealing with this one Principal Problem: Alcohol use disorder, severe, dependence Diagnosis:   Patient Active Problem List   Diagnosis Date Noted  . Substance induced mood disorder [F19.94] 11/27/2014  . Alcohol use disorder, severe, dependence [F10.20] 11/27/2014  . Cocaine use disorder, moderate, dependence [F14.20] 11/27/2014   Total Time spent with patient: 30 minutes   Past Medical History:  Past Medical History  Diagnosis Date  . Arthritis   . Fibromyalgia    History reviewed. No pertinent past surgical history. Family History: History reviewed. No pertinent family history. Social History:  History  Alcohol Use  . Yes    Comment: 8-10 40 ounces daily     History  Drug Use  . Yes  . Special: Cocaine    Comment: $50-$60 sporadic    Social History   Social History  . Marital Status: Single    Spouse Name: N/A  . Number of Children: N/A  . Years of Education: N/A   Social History Main Topics  . Smoking status: Current Every Day Smoker -- 0.50 packs/day for 35 years    Types: Cigarettes  . Smokeless tobacco: None  . Alcohol Use: Yes     Comment: 8-10 40 ounces daily  . Drug Use: Yes    Special: Cocaine     Comment: $50-$60 sporadic  . Sexual Activity: Yes    Birth Control/ Protection: Condom   Other Topics Concern  . None   Social History Narrative   Additional History:    Sleep: Fair  Appetite:  Fair   Assessment:   Musculoskeletal: Strength & Muscle Tone: within normal limits Gait & Station: waling with a limp Patient leans: Right   Psychiatric Specialty Exam: Physical Exam  Review of Systems  Constitutional:  Negative.   HENT: Negative.   Eyes: Negative.   Respiratory: Negative.   Cardiovascular: Negative.   Gastrointestinal: Negative.   Genitourinary: Negative.   Musculoskeletal: Positive for joint pain.  Skin: Negative.   Neurological: Positive for focal weakness.  Endo/Heme/Allergies: Negative.   Psychiatric/Behavioral: Positive for depression and substance abuse. The patient is nervous/anxious.     Blood pressure 114/74, pulse 81, temperature 97.2 F (36.2 C), temperature source Oral, resp. rate 16, height  (1.676 m), weight 69.4 kg (153 lb).Body mass index is 24.71 kg/(m^2).  General Appearance: Fairly Groomed  Patent attorney::  Fair  Speech:  Clear and Coherent  Volume:  Decreased  Mood:  Depressed  Affect:  Restricted  Thought Process:  Coherent and Goal Directed  Orientation:  Full (Time, Place, and Person)  Thought Content:  symptoms events worries concerns  Suicidal Thoughts:  No  Homicidal Thoughts:  No  Memory:  Immediate;   Fair Recent;   Fair Remote;   Fair  Judgement:  Fair  Insight:  Present and Shallow  Psychomotor Activity:  Restlessness  Concentration:  Fair  Recall:  [employer] of Knowledge:Fair  Language: Fair  Akathisia:  No  Handed:  Right  AIMS (if indicated):     Assets:  Desire for Improvement  ADL's:  Intact  Cognition: WNL  Sleep:  Number of Hours: 6  Current Medications: Current Facility-Administered Medications  Medication Dose Route Frequency Provider Last Rate Last Dose  . acetaminophen (TYLENOL) tablet 650 mg  650 mg Oral Q6H PRN Worthy Flank, NP      . alum & mag hydroxide-simeth (MAALOX/MYLANTA) 200-200-20 MG/5ML suspension 30 mL  30 mL Oral Q4H PRN Worthy Flank, NP      . ammonium lactate (LAC-HYDRIN) 12 % lotion   Topical PRN Kerry Hough, PA-C      . feeding supplement (ENSURE ENLIVE) (ENSURE ENLIVE) liquid 237 mL  237 mL Oral BID BM Rachael Fee, MD   237 mL at 12/03/14 1500  . FLUoxetine (PROZAC) capsule 20 mg  20  mg Oral Daily Craige Cotta, MD   20 mg at 12/03/14 0750  . gabapentin (NEURONTIN) capsule 200 mg  200 mg Oral TID Rachael Fee, MD   200 mg at 12/03/14 1157  . magnesium hydroxide (MILK OF MAGNESIA) suspension 30 mL  30 mL Oral Daily PRN Worthy Flank, NP      . meloxicam (MOBIC) tablet 15 mg  15 mg Oral Daily Worthy Flank, NP   15 mg at 12/03/14 0749  . methocarbamol (ROBAXIN) tablet 500 mg  500 mg Oral QID Sanjuana Kava, NP   500 mg at 12/03/14 1157  . multivitamin with minerals tablet 1 tablet  1 tablet Oral Daily Craige Cotta, MD   1 tablet at 12/03/14 0749  . nicotine polacrilex (NICORETTE) gum 2 mg  2 mg Oral PRN Rachael Fee, MD      . pantoprazole (PROTONIX) EC tablet 20 mg  20 mg Oral Daily Craige Cotta, MD   20 mg at 12/03/14 0749  . pneumococcal 23 valent vaccine (PNU-IMMUNE) injection 0.5 mL  0.5 mL Intramuscular Tomorrow-1000 Rachael Fee, MD   0.5 mL at 11/28/14 1000  . thiamine (VITAMIN B-1) tablet 100 mg  100 mg Oral Daily Craige Cotta, MD   100 mg at 12/03/14 0750  . traZODone (DESYREL) tablet 50 mg  50 mg Oral QHS PRN Worthy Flank, NP        Lab Results: No results found for this or any previous visit (from the past 48 hour(s)).  Physical Findings: AIMS: Facial and Oral Movements Muscles of Facial Expression: None, normal Lips and Perioral Area: None, normal Jaw: None, normal Tongue: None, normal,Extremity Movements Upper (arms, wrists, hands, fingers): None, normal Lower (legs, knees, ankles, toes): None, normal, Trunk Movements Neck, shoulders, hips: None, normal, Overall Severity Severity of abnormal movements (highest score from questions above): None, normal Incapacitation due to abnormal movements: None, normal Patient's awareness of abnormal movements (rate only patient's report): No Awareness, Dental Status Current problems with teeth and/or dentures?: Yes Does patient usually wear dentures?: No  CIWA:  CIWA-Ar Total: 0 COWS:      Treatment Plan Summary: Daily contact with patient to assess and evaluate symptoms and progress in treatment and Medication management Supportive approach/coping skills Alcohol cocaine dependence; work a relapse prevention plan Depression; will go ahead and work with CBT/minfulness and will increase the Prozac to 30 mg daily Will facilitate transfer to [facility] on Sunday   Medical Decision Making:  Review of Psycho-Social Stressors (1) and Review of Medication Regimen & Side Effects (2)     Leinaala Catanese A 12/03/2014, 3:55 PM

## 2014-12-03 NOTE — Progress Notes (Signed)
D: Pt presents flat and depressed. Pt reports that he is worried about his upcoming neurosurgery. Pt reports having no support sytem. Pt is concerned about taking care of himself post-surgery. Pt is currently homeless. Pt was encouraged to further discuss these specific matters with his SW prior to discharging. Pt remained in his room for the majority of the shift. Pt reports having generalized muscle aches/spasms.  A: Writer administered scheduled medications to pt, per MD orders. Continued support and availability as needed was extended to this pt. Staff continue to monitor pt with q75min checks.  R: No adverse drug reactions noted. Pt receptive to treatment. Pt remains safe at this time.

## 2014-12-03 NOTE — Progress Notes (Signed)
D: Patient is A&Ox4, denies SI/HI and A/V hallucinations. Patient spoke about leaving on Sunday to have surgery on Monday. Patient is bright, pleasant, and cooperative. Patient rates his depression at a 5, hopelessness at a 4, and anxiety at a 3.  A: Patient given medications as scheduled and prn. Emotional support given as needed. Continue monitoring patient q15 minutes and prn for safety. R: Patient remains safe. Patient verbalized understanding to find staff if he no longer feels safe or if anything changes.  Ahrianna Siglin, Wyman Songster, RN

## 2014-12-03 NOTE — [facility] Group Notes (Signed)
[facility] LCSW Group Therapy  12/03/2014 1:17 PM  Type of Therapy:  Group Therapy  Participation Level:  Minimal  Participation Quality:  Attentive  Affect:  Flat  Cognitive:  Oriented  Insight:  Improving  Engagement in Therapy:  Improving  Modes of Intervention:  Confrontation, Discussion, Education, Exploration, Problem-solving, Rapport Building, Socialization and Support  Summary of Progress/Problems: Feelings around Relapse. Group members discussed the meaning of relapse and shared personal stories of relapse, how it affected them and others, and how they perceived themselves during this time. Group members were encouraged to identify triggers, warning signs and coping skills used when facing the possibility of relapse. Social supports were discussed and explored in detail. Post Acute Withdrawal Syndrome (handout provided) was introduced and examined. Pt's were encouraged to ask questions, talk about key points associated with PAWS, and process this information in terms of relapse prevention. Darren Graham was attentive and engaged during today's processing group. He shared that he is nervous about his back surgery and hopes to stay clean by going to AA/[facility]/and going to and [facility] when his back is healed and he is discharged from the hospital.   Smart, Darren Graham Seta LCSWA 12/03/2014, 1:17 PM

## 2014-12-03 NOTE — Progress Notes (Signed)
D.  Pt pleasant on approach, requested Robaxin at HS but this has been discontinued.  Pt had not had this discussed with him and would like for it to be.  Pt denies SI/HI/hallucinations Positive for wrap up group, interacting appropriately with peers on the unit.  A.  Support and encouragement offered, NP notified of Robaxin discontinuation with request for dose for tonight if possible.  R.  Pt remains safe on the unit, will continue to monitor.

## 2014-12-03 NOTE — Progress Notes (Signed)
Tonight in wrap up group patient stated that his day was a 5 he enjoyed being able to sit with his other hall mates and watch the movies that were on the tv today.

## 2014-12-04 NOTE — Progress Notes (Signed)
D) Pt was in the groups today but slept through the afternoon group. States he is not getting enough sleep. Affect is flat and mood is depressed, although Pt states he is getting better. Denies SI and HI. Offered during group to leave and go back to his room because he was deeply asleep. Pt responded "No, I will stay. I am tired but maybe I will hear something" A) Given support and reassurance along with praise. Encouraged to go to bed early tonight and rest so he will be able to participate tomorrow. R) Denies SI and HI.

## 2014-12-04 NOTE — Progress Notes (Signed)
Psychoeducational Group Note  Date:  12/04/2014 Time: 1015  Group Topic/Focus:  Identifying Needs:   The focus of this group is to help patients identify their personal needs that have been historically problematic and identify healthy behaviors to address their needs.  Participation Level:  Minimal  Participation Quality:  Drowsy  Affect:  Blunted  Cognitive:  Lacking  Insight:  Engaged  Engagement in Group:  Lacking  Additional Comments:    12/04/2014,4:31 PM Sora Vrooman, Joie Bimler

## 2014-12-04 NOTE — [facility] Group Notes (Addendum)
[facility] Group Notes:  (Nursing/MHT/Case Management/Adjunct)  Date:  12/04/2014  Time:  10:30 AM  Type of Therapy:  Nurse Education   / Goals Group  : The group is focused on teaching patients how to set attainable goals as well as how to develop skills needed to accomplish these goals.    Participation Level:  Did Not Attend  Participation Quality:  N/A  Affect:    Cognitive:    Insight:    Engagement in Group:    Modes of Intervention:    Summary of Progress/Problems:  Darren Graham 12/04/2014, 10:30 AM

## 2014-12-04 NOTE — Progress Notes (Signed)
Patient ID: Darren Graham, male   DOB: Aug 23, 1965, 49 y.o.   MRN: [id number] [facility] MD Progress Note  12/04/2014 10:57 AM Darren Graham  MRN:  [id number] Subjective:  Darren Graham states he just needs sleep.  He states that his moods are stable and he is getting better every day.   Principal Problem: Alcohol use disorder, severe, dependence Diagnosis:   Patient Active Problem List   Diagnosis Date Noted  . Substance induced mood disorder [F19.94] 11/27/2014  . Alcohol use disorder, severe, dependence [F10.20] 11/27/2014  . Cocaine use disorder, moderate, dependence [F14.20] 11/27/2014   Total Time spent with patient: 30 minutes   Past Medical History:  Past Medical History  Diagnosis Date  . Arthritis   . Fibromyalgia    History reviewed. No pertinent past surgical history. Family History: History reviewed. No pertinent family history. Social History:  History  Alcohol Use  . Yes    Comment: 8-10 40 ounces daily     History  Drug Use  . Yes  . Special: Cocaine    Comment: $50-$60 sporadic    Social History   Social History  . Marital Status: Single    Spouse Name: N/A  . Number of Children: N/A  . Years of Education: N/A   Social History Main Topics  . Smoking status: Current Every Day Smoker -- 0.50 packs/day for 35 years    Types: Cigarettes  . Smokeless tobacco: None  . Alcohol Use: Yes     Comment: 8-10 40 ounces daily  . Drug Use: Yes    Special: Cocaine     Comment: $50-$60 sporadic  . Sexual Activity: Yes    Birth Control/ Protection: Condom   Other Topics Concern  . None   Social History Narrative   Additional History:    Sleep: Fair  Appetite:  Fair   Assessment:   Musculoskeletal: Strength & Muscle Tone: within normal limits Gait & Station: waling with a limp Patient leans: Right   Psychiatric Specialty Exam: Physical Exam  Review of Systems  Constitutional: Negative.   HENT: Negative.   Eyes: Negative.   Respiratory: Negative.    Cardiovascular: Negative.   Gastrointestinal: Negative.   Genitourinary: Negative.   Musculoskeletal: Positive for joint pain.  Skin: Negative.   Neurological: Positive for focal weakness.  Endo/Heme/Allergies: Negative.   Psychiatric/Behavioral: Positive for depression and substance abuse. The patient is nervous/anxious.     Blood pressure 102/59, pulse 78, temperature 98.4 F (36.9 C), temperature source Oral, resp. rate 14, height 5\' 6"  (1.676 m), weight 69.4 kg (153 lb).Body mass index is 24.71 kg/(m^2).  General Appearance: Fairly Groomed  Patent attorney::  Fair  Speech:  Clear and Coherent  Volume:  Decreased  Mood:  Depressed  Affect:  Restricted  Thought Process:  Coherent and Goal Directed  Orientation:  Full (Time, Place, and Person)  Thought Content:  symptoms events worries concerns  Suicidal Thoughts:  No  Homicidal Thoughts:  No  Memory:  Immediate;   Fair Recent;   Fair Remote;   Fair  Judgement:  Fair  Insight:  Present and Shallow  Psychomotor Activity:  Restlessness  Concentration:  Fair  Recall:  [employer] of Knowledge:Fair  Language: Fair  Akathisia:  No  Handed:  Right  AIMS (if indicated):     Assets:  Desire for Improvement Physical Health Resilience  ADL's:  Intact  Cognition: WNL  Sleep:  Number of Hours: 6     Current Medications: Current Facility-Administered Medications  Medication Dose Route Frequency Provider Last Rate Last Dose  . acetaminophen (TYLENOL) tablet 650 mg  650 mg Oral Q6H PRN Worthy Flank, NP      . alum & mag hydroxide-simeth (MAALOX/MYLANTA) 200-200-20 MG/5ML suspension 30 mL  30 mL Oral Q4H PRN Worthy Flank, NP      . ammonium lactate (LAC-HYDRIN) 12 % lotion   Topical PRN Kerry Hough, PA-C      . feeding supplement (ENSURE ENLIVE) (ENSURE ENLIVE) liquid 237 mL  237 mL Oral BID BM Rachael Fee, MD   237 mL at 12/04/14 1610  . FLUoxetine (PROZAC) capsule 30 mg  30 mg Oral Daily Rachael Fee, MD   30 mg at  12/04/14 0849  . gabapentin (NEURONTIN) capsule 200 mg  200 mg Oral TID Rachael Fee, MD   200 mg at 12/04/14 0848  . magnesium hydroxide (MILK OF MAGNESIA) suspension 30 mL  30 mL Oral Daily PRN Worthy Flank, NP      . meloxicam (MOBIC) tablet 15 mg  15 mg Oral Daily Worthy Flank, NP   15 mg at 12/04/14 0848  . multivitamin with minerals tablet 1 tablet  1 tablet Oral Daily Craige Cotta, MD   1 tablet at 12/04/14 0849  . nicotine polacrilex (NICORETTE) gum 2 mg  2 mg Oral PRN Rachael Fee, MD      . pantoprazole (PROTONIX) EC tablet 20 mg  20 mg Oral Daily Craige Cotta, MD   20 mg at 12/04/14 0848  . pneumococcal 23 valent vaccine (PNU-IMMUNE) injection 0.5 mL  0.5 mL Intramuscular Tomorrow-1000 Rachael Fee, MD   0.5 mL at 11/28/14 1000  . thiamine (VITAMIN B-1) tablet 100 mg  100 mg Oral Daily Craige Cotta, MD   100 mg at 12/04/14 0849  . traZODone (DESYREL) tablet 50 mg  50 mg Oral QHS PRN Worthy Flank, NP        Lab Results: No results found for this or any previous visit (from the past 48 hour(s)).  Physical Findings: AIMS: Facial and Oral Movements Muscles of Facial Expression: None, normal Lips and Perioral Area: None, normal Jaw: None, normal Tongue: None, normal,Extremity Movements Upper (arms, wrists, hands, fingers): None, normal Lower (legs, knees, ankles, toes): None, normal, Trunk Movements Neck, shoulders, hips: None, normal, Overall Severity Severity of abnormal movements (highest score from questions above): None, normal Incapacitation due to abnormal movements: None, normal Patient's awareness of abnormal movements (rate only patient's report): No Awareness, Dental Status Current problems with teeth and/or dentures?: No Does patient usually wear dentures?: No  CIWA:  CIWA-Ar Total: 0 COWS:     Treatment Plan Summary: Daily contact with patient to assess and evaluate symptoms and progress in treatment and Medication management Supportive  approach/coping skills Alcohol cocaine dependence; work a relapse prevention plan Depression; will go ahead and work with CBT/minfulness and will increase the Prozac to 30 mg daily Will facilitate transfer to [facility] on Sunday   Medical Decision Making:  Review of Psycho-Social Stressors (1) and Review of Medication Regimen & Side Effects (2)   Velna Hatchet May Mussa Groesbeck AGNP-BC 12/04/2014, 10:57 AM

## 2014-12-04 NOTE — [facility] Group Notes (Signed)
[facility] Group Notes: (Clinical Social Work)   12/04/2014      Type of Therapy:  Group Therapy   Participation Level:  Did Not Attend despite MHT prompting   Byron Peacock Grossman-Orr, LCSW 12/04/2014, 12:51 PM     

## 2014-12-04 NOTE — [facility] Group Notes (Signed)
Adult Psychoeducational Group Note  Date:  12/04/2014 Time:  8:00pm Group Topic/Focus:  Wrap-Up Group:   The focus of this group is to help patients review their daily goal of treatment and discuss progress on daily workbooks.  Participation Level:  Active  Participation Quality:  Appropriate and Attentive  Affect:  Appropriate  Cognitive:  Alert and Appropriate  Insight: Appropriate  Engagement in Group:  Engaged  Modes of Intervention:  Discussion  Additional Comments:  Pt. Was attentive and appropriate during tonight's aa group discussion.   Bing Plume D 12/04/2014, 10:45 PM

## 2014-12-05 ENCOUNTER — Inpatient Hospital Stay (HOSPITAL_COMMUNITY)
Admission: AD | Admit: 2014-12-05 | Discharge: 2014-12-08 | DRG: 473 | Disposition: A | Discharge status: Home / Self Care | Payer: Self-pay | Source: Other Acute Inpatient Hospital | Attending: Neurological Surgery | Admitting: Neurological Surgery

## 2014-12-05 ENCOUNTER — Encounter (HOSPITAL_COMMUNITY): Payer: Self-pay | Admitting: Neurological Surgery

## 2014-12-05 DIAGNOSIS — M4802 Spinal stenosis, cervical region: Secondary | ICD-10-CM | POA: Diagnosis present

## 2014-12-05 DIAGNOSIS — F17210 Nicotine dependence, cigarettes, uncomplicated: Secondary | ICD-10-CM | POA: Diagnosis present

## 2014-12-05 DIAGNOSIS — Z79899 Other long term (current) drug therapy: Secondary | ICD-10-CM

## 2014-12-05 DIAGNOSIS — M1990 Unspecified osteoarthritis, unspecified site: Secondary | ICD-10-CM | POA: Diagnosis present

## 2014-12-05 DIAGNOSIS — M4712 Other spondylosis with myelopathy, cervical region: Principal | ICD-10-CM | POA: Diagnosis present

## 2014-12-05 DIAGNOSIS — Z419 Encounter for procedure for purposes other than remedying health state, unspecified: Secondary | ICD-10-CM

## 2014-12-05 DIAGNOSIS — M797 Fibromyalgia: Secondary | ICD-10-CM | POA: Diagnosis present

## 2014-12-05 LAB — CBC
HCT: 36.8 % — ABNORMAL LOW (ref 39.0–52.0)
Hemoglobin: 12.2 g/dL — ABNORMAL LOW (ref 13.0–17.0)
MCH: 30.7 pg (ref 26.0–34.0)
MCHC: 33.2 g/dL (ref 30.0–36.0)
MCV: 92.5 fL (ref 78.0–100.0)
PLATELETS: 236 10*3/uL (ref 150–400)
RBC: 3.98 MIL/uL — ABNORMAL LOW (ref 4.22–5.81)
RDW: 13.5 % (ref 11.5–15.5)
WBC: 7.6 10*3/uL (ref 4.0–10.5)

## 2014-12-05 LAB — BASIC METABOLIC PANEL
Anion gap: 11 (ref 5–15)
BUN: 10 mg/dL (ref 6–20)
CO2: 26 mmol/L (ref 22–32)
CREATININE: 0.89 mg/dL (ref 0.61–1.24)
Calcium: 9.2 mg/dL (ref 8.9–10.3)
Chloride: 101 mmol/L (ref 101–111)
GFR calc Af Amer: >60 mL/min (ref >60)
GLUCOSE: 126 mg/dL — AB (ref 65–99)
POTASSIUM: 3.8 mmol/L (ref 3.5–5.1)
Sodium: 138 mmol/L (ref 135–145)

## 2014-12-05 LAB — SURGICAL PCR SCREEN
MRSA, PCR: NEGATIVE
Staphylococcus aureus: POSITIVE — AB

## 2014-12-05 MED ORDER — CEFAZOLIN SODIUM-DEXTROSE 2-3 GM-% IV SOLR
2.0000 g | INTRAVENOUS | Status: AC
  Administered 2014-12-06: 2 g via INTRAVENOUS
  Filled 2014-12-05: qty 50

## 2014-12-05 MED ORDER — PHENOL 1.4 % MT LIQD: 1.0000 | OROMUCOSAL | Status: DC | PRN

## 2014-12-05 MED ORDER — METHOCARBAMOL 1000 MG/10ML IJ SOLN
500.0000 mg | Freq: Four times a day (QID) | INTRAMUSCULAR | Status: DC | PRN
  Filled 2014-12-05: qty 5

## 2014-12-05 MED ORDER — TRAZODONE HCL 50 MG PO TABS: 50.0000 mg | ORAL_TABLET | Freq: Every evening | ORAL | Status: AC | PRN

## 2014-12-05 MED ORDER — DOCUSATE SODIUM 100 MG PO CAPS
100.0000 mg | ORAL_CAPSULE | Freq: Two times a day (BID) | ORAL | Status: DC
  Administered 2014-12-05 – 2014-12-08 (×5): 100 mg via ORAL
  Filled 2014-12-05 (×5): qty 1

## 2014-12-05 MED ORDER — MELOXICAM 15 MG PO TABS: 15.0000 mg | ORAL_TABLET | Freq: Every day | ORAL | Status: AC

## 2014-12-05 MED ORDER — GABAPENTIN 100 MG PO CAPS
200.0000 mg | ORAL_CAPSULE | Freq: Three times a day (TID) | ORAL | Status: DC
  Administered 2014-12-05 – 2014-12-08 (×6): 200 mg via ORAL
  Filled 2014-12-05 (×8): qty 2

## 2014-12-05 MED ORDER — MENTHOL 3 MG MT LOZG
1.0000 | LOZENGE | OROMUCOSAL | Status: DC | PRN
  Administered 2014-12-07: 3 mg via ORAL
  Filled 2014-12-05: qty 9

## 2014-12-05 MED ORDER — FLUOXETINE HCL 10 MG PO CAPS
30.0000 mg | ORAL_CAPSULE | Freq: Every day | ORAL | Status: AC
Start: 2014-12-05 — End: ?

## 2014-12-05 MED ORDER — METHOCARBAMOL 500 MG PO TABS: 500.0000 mg | ORAL_TABLET | Freq: Four times a day (QID) | ORAL | Status: DC | PRN

## 2014-12-05 MED ORDER — TRAZODONE HCL 50 MG PO TABS: 50.0000 mg | ORAL_TABLET | Freq: Every evening | ORAL | Status: DC | PRN

## 2014-12-05 MED ORDER — HYDROMORPHONE HCL 1 MG/ML IJ SOLN
0.5000 mg | INTRAMUSCULAR | Status: DC | PRN
  Administered 2014-12-06: 1 mg via INTRAVENOUS
  Filled 2014-12-05: qty 1

## 2014-12-05 MED ORDER — MAGNESIUM HYDROXIDE 400 MG/5ML PO SUSP: 30.0000 mL | Freq: Every day | ORAL | Status: DC | PRN

## 2014-12-05 MED ORDER — CEFAZOLIN SODIUM 1-5 GM-% IV SOLN
1.0000 g | Freq: Three times a day (TID) | INTRAVENOUS | Status: AC
  Administered 2014-12-05 – 2014-12-06 (×2): 1 g via INTRAVENOUS
  Filled 2014-12-05 (×2): qty 50

## 2014-12-05 MED ORDER — ACETAMINOPHEN 650 MG RE SUPP: 650.0000 mg | RECTAL | Status: DC | PRN

## 2014-12-05 MED ORDER — SODIUM CHLORIDE 0.9 % IV SOLN: INTRAVENOUS | Status: DC

## 2014-12-05 MED ORDER — SODIUM CHLORIDE 0.9 % IJ SOLN
3.0000 mL | Freq: Two times a day (BID) | INTRAMUSCULAR | Status: DC
Start: 2014-12-05 — End: 2014-12-06

## 2014-12-05 MED ORDER — FLUOXETINE HCL 10 MG PO CAPS
30.0000 mg | ORAL_CAPSULE | Freq: Every day | ORAL | Status: DC
  Administered 2014-12-06 – 2014-12-08 (×3): 30 mg via ORAL
  Filled 2014-12-05 (×3): qty 1

## 2014-12-05 MED ORDER — ACETAMINOPHEN 325 MG PO TABS: 650.0000 mg | ORAL_TABLET | ORAL | Status: DC | PRN

## 2014-12-05 MED ORDER — BISACODYL 10 MG RE SUPP: 10.0000 mg | Freq: Every day | RECTAL | Status: DC | PRN

## 2014-12-05 MED ORDER — ONDANSETRON HCL 4 MG/2ML IJ SOLN
4.0000 mg | INTRAMUSCULAR | Status: DC | PRN
  Administered 2014-12-06: 4 mg via INTRAVENOUS

## 2014-12-05 MED ORDER — GABAPENTIN 100 MG PO CAPS
200.0000 mg | ORAL_CAPSULE | Freq: Three times a day (TID) | ORAL | Status: AC
Start: 2014-12-05 — End: ?

## 2014-12-05 MED ORDER — SODIUM CHLORIDE 0.9 % IV SOLN
250.0000 mL | INTRAVENOUS | Status: DC
  Administered 2014-12-05: 250 mL via INTRAVENOUS

## 2014-12-05 MED ORDER — OXYCODONE-ACETAMINOPHEN 5-325 MG PO TABS
1.0000 | ORAL_TABLET | ORAL | Status: DC | PRN
  Administered 2014-12-06 – 2014-12-08 (×7): 2 via ORAL
  Filled 2014-12-05 (×7): qty 2

## 2014-12-05 MED ORDER — SODIUM CHLORIDE 0.9 % IJ SOLN: 3.0000 mL | INTRAMUSCULAR | Status: DC | PRN

## 2014-12-05 NOTE — Progress Notes (Signed)
Patient assigned to 5C-15 at [facility] (main campus). Report called to [employer]. Patient will transport via non emergent EMS. Lawrence Marseilles

## 2014-12-05 NOTE — Progress Notes (Signed)
Non emergent EMS arrived to transport patient. Patient's VS obtained and WDL. D/C teaching completed, Rx's given along with sample meds. All belongings returned. Patient verbalizes understanding. Patient discharged in stable condition via EMS. Lawrence Marseilles

## 2014-12-05 NOTE — Progress Notes (Signed)
Patient went off unit again without permission or supervision of the staff; he returned within 5 minutes.

## 2014-12-05 NOTE — Progress Notes (Signed)
Patient at desk, tearful. "I think I'm going to stay. I know I need this. I'm just terrified. Neurosurgery, I mean that's a big thing." Patient given emotional support, reassured he is in good hands and will receive good care. Praised for making positive choice. Confirmed to patient that this is his final decision. AC notified. Will notify NP, MD. Lawrence Marseilles

## 2014-12-05 NOTE — H&P (Signed)
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  He was admitted to inpatient psychiatry for substance abuse (ETOH).  There he complained of gait problems and hand weakness and an MRI was done which showed cervical spondylosis with spinal cord compression.  Plans were made for him to be transferred to [facility] tonight for surgery tomorrow.  No new complaints.  PMH: Past Medical History  Diagnosis Date  . Arthritis   . Fibromyalgia     PSH: History reviewed. No pertinent past surgical history.  SH: Social History  Substance Use Topics  . Smoking status: Current Every Day Smoker -- 0.50 packs/day for 35 years    Types: Cigarettes  . Smokeless tobacco: None  . Alcohol Use: Yes     Comment: 8-10 40 ounces daily    MEDS: Prior to Admission medications   Medication Sig Start Date End Date Taking? Authorizing Provider  FLUoxetine (PROZAC) 10 MG capsule Take 3 capsules (30 mg total) by mouth daily. 12/05/14   Adonis Brook, NP  gabapentin (NEURONTIN) 100 MG capsule Take 2 capsules (200 mg total) by mouth 3 (three) times daily. 12/05/14   Adonis Brook, NP  meloxicam (MOBIC) 15 MG tablet Take 1 tablet (15 mg total) by mouth daily. 12/05/14   Adonis Brook, NP  traZODone (DESYREL) 50 MG tablet Take 1 tablet (50 mg total) by mouth at bedtime as needed (May repeat x1). 12/05/14   Adonis Brook, NP    ALLERGY: No Known Allergies  ROS: ROS  NEUROLOGIC EXAM: Awake, alert, oriented Memory and concentration grossly intact Speech fluent, appropriate CN grossly intact Motor exam: Upper Extremities Deltoid Bicep Tricep Grip  Right 5/5 5/5 5/5 4/5  Left 5/5 5/5 5/5 4/5   Lower Extremity IP Quad PF DF EHL  Right 5/5 5/5 5/5 5/5 5/5  Left 5/5 5/5 5/5 5/5 5/5   Sensation grossly intact to LT Shuffling gait  IMAGING:  No new imaging since initial eval last Wednesday.  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   Exam unchanged.  PLAN: - NPO after midnight - C3-4, 4-5 ACDF tomorrow

## 2014-12-05 NOTE — Progress Notes (Addendum)
Met with patient 1:1. He expresses some worry about pending surgery tomorrow and meds after discharge. "Will they give me meds when I leave? I can't afford them." Patient rates his depression at a 2/10, hopelessness at a 1/10 and anxiety at a 3/10. His goal for today is to "stay sober and pray." Pain is a 7/10 and he verbalizes understanding that he cannot take robaxin due to surgery. Patient offered support and reassurance. He denies SI/HI and remains safe. Jamie Kato   Add note @1100 :  Patient alerting staff that he would like to be discharged this afternoon prior to surgery tomorrow. "Just give me the information and I will show up." This Probation officer, charge RN and NP spoke with patient explaining order is to transfer to [facility], not for discharge/readmit. Explained this is standing policy and that he does have the right to refuse further treatment (ie. Surgery). Encouraged patient to consider what a great opportunity this is for him to increase his quality of life. Explained condition could worsen effecting his future mobility.  "If they're willing to do it now, they'll do it later." NP, MD made aware. Orders received to discharge patient. Patient informed. Jamie Kato

## 2014-12-05 NOTE — [facility] Suicide Risk Assessment (Signed)
[facility] Discharge Suicide Risk Assessment   Demographic Factors:  Male  Total Time spent with patient: 30 minutes  Musculoskeletal: Strength & Muscle Tone: within normal limits Gait & Station: unsteady Patient leans: Right  Psychiatric Specialty Exam: Physical Exam  Review of Systems  Musculoskeletal: Positive for back pain.  Psychiatric/Behavioral: Positive for substance abuse (stable). Negative for depression and suicidal ideas.  All other systems reviewed and are negative.   Blood pressure 92/71, pulse 82, temperature 98.6 F (37 C), temperature source Oral, resp. rate 15, height  (1.676 m), weight 69.4 kg (153 lb).Body mass index is 24.71 kg/(m^2).  General Appearance: Casual  Eye Contact::  Good  Speech:  Clear and Coherent409  Volume:  Normal  Mood:  Euthymic  Affect:  Appropriate  Thought Process:  Coherent  Orientation:  Full (Time, Place, and Person)  Thought Content:  WDL  Suicidal Thoughts:  No  Homicidal Thoughts:  No  Memory:  Immediate;   Fair Recent;   Fair Remote;   Fair  Judgement:  Fair  Insight:  Fair  Psychomotor Activity:  Normal  Concentration:  Fair  Recall:  [employer] of Knowledge:Fair  Language: Fair  Akathisia:  No  Handed:  Right  AIMS (if indicated):     Assets:  Communication Skills Desire for Improvement  Sleep:  Number of Hours: 6  Cognition: WNL  ADL's:  Intact   Have you used any form of tobacco in the last 30 days? (Cigarettes, Smokeless Tobacco, Cigars, and/or Pipes): Yes  Has this patient used any form of tobacco in the last 30 days? (Cigarettes, Smokeless Tobacco, Cigars, and/or Pipes) Yes, A prescription for an FDA-approved tobacco cessation medication was offered at discharge and the patient refused  Mental Status Per Nursing Assessment::   On Admission:  NA  Current Mental Status by Physician: pt denies SI/HI/AH/VH  Loss Factors: NA  Historical Factors: Impulsivity  Risk Reduction Factors:   Positive social  support  Continued Clinical Symptoms:  Alcohol/Substance Abuse/Dependencies Previous Psychiatric Diagnoses and Treatments Medical Diagnoses and Treatments/Surgeries  Cognitive Features That Contribute To Risk:  None    Suicide Risk:  Minimal: No identifiable suicidal ideation.  Patients presenting with no risk factors but with morbid ruminations; may be classified as minimal risk based on the severity of the depressive symptoms  Principal Problem: Alcohol use disorder, severe, dependence Discharge Diagnoses:  Patient Active Problem List   Diagnosis Date Noted  . Substance induced mood disorder [F19.94] 11/27/2014  . Alcohol use disorder, severe, dependence [F10.20] 11/27/2014  . Cocaine use disorder, moderate, dependence [F14.20] 11/27/2014    Follow-up Information    Follow up with [facility].   Specialty:  Behavioral Health   Why:  Walk-in clinic Monday-Friday between 8am to 3pm for assessment for therapy and medication management services.    Contact information:   [address] [place] [address] [phone]       Plan Of Care/Follow-up recommendations:  Activity:  No restrictions Diet:  regular Tests:  as per out pt/medical floor MD recommendations Other:  None  Is patient on multiple antipsychotic therapies at discharge:  No   Has Patient had three or more failed trials of antipsychotic monotherapy by history:  No  Recommended Plan for Multiple Antipsychotic Therapies: NA    Sela Falk md 12/05/2014, 9:21 AM

## 2014-12-05 NOTE — Progress Notes (Addendum)
Received patient via ambulance transport from [facility] as direct admit; Dr.Ditty notified of arrival; patient is alert and oriented; oriented to room and unit routine; patient insisting on walking and going outside for "fresh air"; patient left unit without a staff member after he was instructed a staff member must walk with him; he returned to the room after 15 minutes; "I'm just extremely anxious about having surgery",per patient.  I won't go outside again tonight.  Reviewed fall safety policy with patient and he is aware.

## 2014-12-05 NOTE — Plan of Care (Signed)
Problem: Alteration in mood; excessive anxiety as evidenced by: Goal: STG-Pt will report an absence of self-harm thoughts/actions (Patient will report an absence of self-harm thoughts or actions)  Outcome: Progressing Patient denies SI or thoughts to self harm.  Problem: Alteration in mood & ability to function due to Goal: STG-Patient will comply with prescribed medication regimen (Patient will comply with prescribed medication regimen)  Outcome: Progressing Patient is med compliant

## 2014-12-05 NOTE — Progress Notes (Signed)
Patient ID: Darren Graham, male   DOB: 06-11-1965, 49 y.o.   MRN: [id number] Pt visible in the milieu. Interacting appropriately with staff and peers. Needs assessed. Pt denied. Pt only reported that he is "leary" about his upcoming surgery. Support and encouragement provided. Pt receptive. Pt denied SI, HI, and AVH. Fifteen minute checks continue for patient safety. Pt safe on unit.

## 2014-12-06 ENCOUNTER — Telehealth: Payer: Self-pay

## 2014-12-06 ENCOUNTER — Inpatient Hospital Stay (HOSPITAL_COMMUNITY): Payer: MEDICAID | Admitting: Anesthesiology

## 2014-12-06 ENCOUNTER — Inpatient Hospital Stay (HOSPITAL_COMMUNITY): Payer: Self-pay

## 2014-12-06 ENCOUNTER — Encounter (HOSPITAL_COMMUNITY): Admission: AD | Disposition: A | Discharge status: Home / Self Care | Payer: Self-pay | Attending: Neurological Surgery

## 2014-12-06 ENCOUNTER — Inpatient Hospital Stay (HOSPITAL_COMMUNITY): Payer: Self-pay | Admitting: Anesthesiology

## 2014-12-06 ENCOUNTER — Encounter (HOSPITAL_COMMUNITY): Payer: Self-pay | Admitting: Certified Registered"

## 2014-12-06 HISTORY — PX: ANTERIOR CERVICAL DECOMP/DISCECTOMY FUSION: SHX1161

## 2014-12-06 SURGERY — ANTERIOR CERVICAL DECOMPRESSION/DISCECTOMY FUSION 2 LEVELS
Anesthesia: General | Site: Spine Cervical

## 2014-12-06 MED ORDER — ROCURONIUM BROMIDE 100 MG/10ML IV SOLN
INTRAVENOUS | Status: DC | PRN
  Administered 2014-12-06: 50 mg via INTRAVENOUS

## 2014-12-06 MED ORDER — PROMETHAZINE HCL 25 MG/ML IJ SOLN: 6.2500 mg | INTRAMUSCULAR | Status: DC | PRN

## 2014-12-06 MED ORDER — NALOXONE HCL 0.4 MG/ML IJ SOLN: 0.4000 mg | INTRAMUSCULAR | Status: DC | PRN

## 2014-12-06 MED ORDER — LIDOCAINE-EPINEPHRINE 1 %-1:100000 IJ SOLN
INTRAMUSCULAR | Status: DC | PRN
  Administered 2014-12-06: 2.5 mL

## 2014-12-06 MED ORDER — ACETAMINOPHEN 10 MG/ML IV SOLN
INTRAVENOUS | Status: AC
  Administered 2014-12-06: 1000 mg via INTRAVENOUS
  Filled 2014-12-06: qty 100

## 2014-12-06 MED ORDER — BUPIVACAINE-EPINEPHRINE (PF) 0.25% -1:200000 IJ SOLN
INTRAMUSCULAR | Status: DC | PRN
  Administered 2014-12-06: 2.5 mL via PERINEURAL

## 2014-12-06 MED ORDER — THROMBIN 5000 UNITS EX SOLR
OROMUCOSAL | Status: DC | PRN
  Administered 2014-12-06: 12:00:00 via TOPICAL

## 2014-12-06 MED ORDER — THROMBIN 5000 UNITS EX SOLR
CUTANEOUS | Status: DC | PRN
  Administered 2014-12-06 (×2): 5000 [IU] via TOPICAL

## 2014-12-06 MED ORDER — OXYCODONE HCL 5 MG/5ML PO SOLN: 5.0000 mg | Freq: Once | ORAL | Status: DC | PRN

## 2014-12-06 MED ORDER — ONDANSETRON HCL 4 MG/2ML IJ SOLN
INTRAMUSCULAR | Status: AC
  Filled 2014-12-06: qty 4

## 2014-12-06 MED ORDER — SUFENTANIL CITRATE 50 MCG/ML IV SOLN
INTRAVENOUS | Status: DC | PRN
  Administered 2014-12-06: 10 ug via INTRAVENOUS
  Administered 2014-12-06: 20 ug via INTRAVENOUS
  Administered 2014-12-06 (×2): 10 ug via INTRAVENOUS

## 2014-12-06 MED ORDER — DEXAMETHASONE SODIUM PHOSPHATE 10 MG/ML IJ SOLN
INTRAMUSCULAR | Status: DC | PRN
  Administered 2014-12-06: 10 mg via INTRAVENOUS

## 2014-12-06 MED ORDER — ROCURONIUM BROMIDE 50 MG/5ML IV SOLN
INTRAVENOUS | Status: AC
  Filled 2014-12-06: qty 1

## 2014-12-06 MED ORDER — EPHEDRINE SULFATE 50 MG/ML IJ SOLN
INTRAMUSCULAR | Status: DC | PRN
  Administered 2014-12-06: 10 mg via INTRAVENOUS

## 2014-12-06 MED ORDER — LIDOCAINE HCL (CARDIAC) 20 MG/ML IV SOLN
INTRAVENOUS | Status: DC | PRN
  Administered 2014-12-06: 100 mg via INTRAVENOUS

## 2014-12-06 MED ORDER — LACTATED RINGERS IV SOLN
INTRAVENOUS | Status: DC | PRN
  Administered 2014-12-06 (×2): via INTRAVENOUS

## 2014-12-06 MED ORDER — MIDAZOLAM HCL 5 MG/5ML IJ SOLN
INTRAMUSCULAR | Status: DC | PRN
  Administered 2014-12-06: 2 mg via INTRAVENOUS

## 2014-12-06 MED ORDER — HEMOSTATIC AGENTS (NO CHARGE) OPTIME
TOPICAL | Status: DC | PRN
  Administered 2014-12-06: 1 via TOPICAL

## 2014-12-06 MED ORDER — SODIUM CHLORIDE 0.9 % IJ SOLN: 9.0000 mL | INTRAMUSCULAR | Status: DC | PRN

## 2014-12-06 MED ORDER — OXYCODONE HCL 5 MG PO TABS: 5.0000 mg | ORAL_TABLET | Freq: Once | ORAL | Status: DC | PRN

## 2014-12-06 MED ORDER — HYDROMORPHONE 0.3 MG/ML IV SOLN: INTRAVENOUS | Status: DC

## 2014-12-06 MED ORDER — MIDAZOLAM HCL 2 MG/2ML IJ SOLN
INTRAMUSCULAR | Status: AC
  Filled 2014-12-06: qty 4

## 2014-12-06 MED ORDER — KETAMINE HCL 100 MG/ML IJ SOLN
INTRAMUSCULAR | Status: AC
  Administered 2014-12-06: 30 mg via INTRAVENOUS
  Administered 2014-12-06: 50 mg via INTRAVENOUS
  Filled 2014-12-06: qty 1

## 2014-12-06 MED ORDER — HYDROMORPHONE HCL 1 MG/ML IJ SOLN
0.2500 mg | INTRAMUSCULAR | Status: DC | PRN
  Administered 2014-12-06 (×2): 0.5 mg via INTRAVENOUS

## 2014-12-06 MED ORDER — DIPHENHYDRAMINE HCL 50 MG/ML IJ SOLN: 12.5000 mg | Freq: Four times a day (QID) | INTRAMUSCULAR | Status: DC | PRN

## 2014-12-06 MED ORDER — PROPOFOL 10 MG/ML IV BOLUS
INTRAVENOUS | Status: DC | PRN
  Administered 2014-12-06: 160 mg via INTRAVENOUS

## 2014-12-06 MED ORDER — DIPHENHYDRAMINE HCL 12.5 MG/5ML PO ELIX: 12.5000 mg | ORAL_SOLUTION | Freq: Four times a day (QID) | ORAL | Status: DC | PRN

## 2014-12-06 MED ORDER — DEXAMETHASONE SODIUM PHOSPHATE 10 MG/ML IJ SOLN
INTRAMUSCULAR | Status: AC
Start: 2014-12-06 — End: 2014-12-06
  Filled 2014-12-06: qty 1

## 2014-12-06 MED ORDER — CEFAZOLIN SODIUM-DEXTROSE 2-3 GM-% IV SOLR
2.0000 g | Freq: Three times a day (TID) | INTRAVENOUS | Status: AC
  Administered 2014-12-06 – 2014-12-07 (×2): 2 g via INTRAVENOUS
  Filled 2014-12-06 (×2): qty 50

## 2014-12-06 MED ORDER — ONDANSETRON HCL 4 MG/2ML IJ SOLN
4.0000 mg | Freq: Four times a day (QID) | INTRAMUSCULAR | Status: DC | PRN
  Administered 2014-12-06: 4 mg via INTRAVENOUS
  Filled 2014-12-06: qty 2

## 2014-12-06 MED ORDER — SUFENTANIL CITRATE 50 MCG/ML IV SOLN
INTRAVENOUS | Status: AC
  Filled 2014-12-06: qty 1

## 2014-12-06 MED ORDER — SODIUM CHLORIDE 0.9 % IR SOLN
Status: DC | PRN
  Administered 2014-12-06: 12:00:00

## 2014-12-06 MED ORDER — ONDANSETRON HCL 4 MG/2ML IJ SOLN: 4.0000 mg | Freq: Four times a day (QID) | INTRAMUSCULAR | Status: DC | PRN

## 2014-12-06 MED ORDER — PROPOFOL 10 MG/ML IV BOLUS
INTRAVENOUS | Status: AC
  Filled 2014-12-06: qty 20

## 2014-12-06 MED ORDER — 0.9 % SODIUM CHLORIDE (POUR BTL) OPTIME
TOPICAL | Status: DC | PRN
  Administered 2014-12-06: 1000 mL

## 2014-12-06 SURGICAL SUPPLY — 71 items
APPLICATOR CHLORAPREP 3ML ORNG (MISCELLANEOUS) ×6 IMPLANT
BIT DRILL NEURO 2X3.1 SFT TUCH (MISCELLANEOUS) ×1 IMPLANT
BIT DRILL POWER (BIT) ×1 IMPLANT
BLADE ULTRA TIP 2M (BLADE) ×2 IMPLANT
BONE MATRIX OSTEOCEL PRO SM (Bone Implant) ×2 IMPLANT
CAGE COROENT SM 6X13X15 (Cage) ×2 IMPLANT
CAGE SMALL 7X13X15 (Cage) ×2 IMPLANT
CANISTER SUCT 3000ML PPV (MISCELLANEOUS) ×2 IMPLANT
DECANTER SPIKE VIAL GLASS SM (MISCELLANEOUS) ×2 IMPLANT
DERMABOND ADVANCED (GAUZE/BANDAGES/DRESSINGS) ×1
DERMABOND ADVANCED .7 DNX12 (GAUZE/BANDAGES/DRESSINGS) ×1 IMPLANT
DRAPE C-ARM 42X72 X-RAY (DRAPES) ×6 IMPLANT
DRAPE LAPAROTOMY 100X72 PEDS (DRAPES) ×2 IMPLANT
DRAPE MICROSCOPE LEICA (MISCELLANEOUS) ×2 IMPLANT
DRAPE POUCH INSTRU U-SHP 10X18 (DRAPES) ×2 IMPLANT
DRAPE PROXIMA HALF (DRAPES) ×4 IMPLANT
DRILL BIT POWER (BIT) ×1
DRILL NEURO 2X3.1 SOFT TOUCH (MISCELLANEOUS) ×2
ELECT COATED BLADE 2.86 ST (ELECTRODE) ×2 IMPLANT
ELECT REM PT RETURN 9FT ADLT (ELECTROSURGICAL) ×2
ELECTRODE REM PT RTRN 9FT ADLT (ELECTROSURGICAL) ×1 IMPLANT
EVACUATOR 1/8 PVC DRAIN (DRAIN) ×2 IMPLANT
GAUZE SPONGE 4X4 12PLY STRL (GAUZE/BANDAGES/DRESSINGS) IMPLANT
GAUZE SPONGE 4X4 16PLY XRAY LF (GAUZE/BANDAGES/DRESSINGS) IMPLANT
GLOVE BIO SURGEON STRL SZ 6.5 (GLOVE) ×2 IMPLANT
GLOVE BIO SURGEON STRL SZ8 (GLOVE) ×2 IMPLANT
GLOVE BIOGEL PI IND STRL 7.0 (GLOVE) ×1 IMPLANT
GLOVE BIOGEL PI IND STRL 7.5 (GLOVE) ×2 IMPLANT
GLOVE BIOGEL PI IND STRL 8 (GLOVE) ×2 IMPLANT
GLOVE BIOGEL PI INDICATOR 7.0 (GLOVE) ×1
GLOVE BIOGEL PI INDICATOR 7.5 (GLOVE) ×2
GLOVE BIOGEL PI INDICATOR 8 (GLOVE) ×2
GLOVE ECLIPSE 7.0 STRL STRAW (GLOVE) IMPLANT
GLOVE ECLIPSE 7.5 STRL STRAW (GLOVE) ×8 IMPLANT
GLOVE EXAM NITRILE LRG STRL (GLOVE) IMPLANT
GLOVE INDICATOR 8.5 STRL (GLOVE) ×2 IMPLANT
GLOVE SS BIOGEL STRL SZ 7 (GLOVE) ×3 IMPLANT
GLOVE SUPERSENSE BIOGEL SZ 7 (GLOVE) ×3
GLOVE SURG SS PI 7.5 STRL IVOR (GLOVE) ×2 IMPLANT
GOWN STRL REIN 2XL LVL4 (GOWN DISPOSABLE) ×4 IMPLANT
GOWN STRL REUS W/ TWL LRG LVL3 (GOWN DISPOSABLE) ×3 IMPLANT
GOWN STRL REUS W/ TWL XL LVL3 (GOWN DISPOSABLE) ×2 IMPLANT
GOWN STRL REUS W/TWL LRG LVL3 (GOWN DISPOSABLE) ×3
GOWN STRL REUS W/TWL XL LVL3 (GOWN DISPOSABLE) ×2
HEMOSTAT POWDER KIT SURGIFOAM (HEMOSTASIS) ×2 IMPLANT
KIT BASIN OR (CUSTOM PROCEDURE TRAY) ×2 IMPLANT
KIT ROOM TURNOVER OR (KITS) ×2 IMPLANT
LIGHT SOURCE ANGLE TIP STR 7FT (MISCELLANEOUS) ×2 IMPLANT
NEEDLE HYPO 25X1 1.5 SAFETY (NEEDLE) ×2 IMPLANT
NEEDLE SPNL 22GX3.5 QUINCKE BK (NEEDLE) ×2 IMPLANT
NS IRRIG 1000ML POUR BTL (IV SOLUTION) ×2 IMPLANT
PACK LAMINECTOMY NEURO (CUSTOM PROCEDURE TRAY) ×2 IMPLANT
PAD ARMBOARD 7.5X6 YLW CONV (MISCELLANEOUS) ×6 IMPLANT
PATTIES SURGICAL .5X1.5 (GAUZE/BANDAGES/DRESSINGS) ×2 IMPLANT
PIN DISTRACTION 14MM (PIN) ×4 IMPLANT
PLATE HELIX T 40MM (Plate) ×2 IMPLANT
RUBBERBAND STERILE (MISCELLANEOUS) ×6 IMPLANT
SCREW FIXED SELF TAP 4X17 (Screw) ×10 IMPLANT
SCREW HELIX ACP 4X17 SELF-TAP (Screw) ×2 IMPLANT
SPONGE INTESTINAL PEANUT (DISPOSABLE) ×2 IMPLANT
SPONGE SURGIFOAM ABS GEL SZ50 (HEMOSTASIS) ×2 IMPLANT
STAPLER VISISTAT 35W (STAPLE) ×4 IMPLANT
STOCKINETTE 6  STRL (DRAPES) ×1
STOCKINETTE 6 STRL (DRAPES) ×1 IMPLANT
SUT BONE WAX W31G (SUTURE) ×2 IMPLANT
SUT VIC AB 3-0 SH 8-18 (SUTURE) ×4 IMPLANT
TOWEL OR 17X24 6PK STRL BLUE (TOWEL DISPOSABLE) ×4 IMPLANT
TOWEL OR 17X26 10 PK STRL BLUE (TOWEL DISPOSABLE) ×2 IMPLANT
TRAY FOLEY CATH 16FRSI W/METER (SET/KITS/TRAYS/PACK) ×2 IMPLANT
TUBE CONNECTING 12X1/4 (SUCTIONS) ×2 IMPLANT
WATER STERILE IRR 1000ML POUR (IV SOLUTION) ×2 IMPLANT

## 2014-12-06 NOTE — Anesthesia Preprocedure Evaluation (Addendum)
Anesthesia Evaluation  Patient identified by MRN, date of birth, ID band Patient awake    Reviewed: Allergy & Precautions, H&P , NPO status , Patient's Chart, lab work & pertinent test results  History of Anesthesia Complications Negative for: history of anesthetic complications  Airway Mallampati: I  TM Distance: >3 FB Neck ROM: full    Dental  (+) Poor Dentition Several missing teeth, the others are chipped:   Pulmonary neg pulmonary ROS, Current Smoker,  breath sounds clear to auscultation  Pulmonary exam normal       Cardiovascular negative cardio ROS Normal cardiovascular examRhythm:regular Rate:Normal     Neuro/Psych PSYCHIATRIC DISORDERS Depression  Neuromuscular disease    GI/Hepatic negative GI ROS, (+)     substance abuse  alcohol use,   Endo/Other  negative endocrine ROS  Renal/GU negative Renal ROS     Musculoskeletal  (+) Fibromyalgia -  Abdominal   Peds  Hematology negative hematology ROS (+)   Anesthesia Other Findings NPO appropriate, allergies reviewed Denies active cardiac or pulmonary symptoms, METS > 4 No recent congestive cough or symptoms of upper respiratory infection    Reproductive/Obstetrics negative OB ROS                            Anesthesia Physical Anesthesia Plan  ASA: III  Anesthesia Plan: General   Post-op Pain Management:    Induction: Intravenous  Airway Management Planned: Oral ETT  Additional Equipment:   Intra-op Plan:   Post-operative Plan:   Informed Consent: I have reviewed the patients History and Physical, chart, labs and discussed the procedure including the risks, benefits and alternatives for the proposed anesthesia with the patient or authorized representative who has indicated his/her understanding and acceptance.     Plan Discussed with: Anesthesiologist, CRNA and Surgeon  Anesthesia Plan Comments: (Was admitted to  psychiatry inpatient for EtOH abuse, found to have cervical myelopathy Intubation with glidescope given cervical myelopathy symptoms, multimodal with tylenol+steroids+opioids+/- ketamine)        Anesthesia Quick Evaluation

## 2014-12-06 NOTE — Telephone Encounter (Signed)
Call received from Elmer Bales, CM requesting hospital follow up for the patient.  At this time he does not qualify for the Transitional Care Program.  He is scheduled for surgery today and a hospital follow up appointment can be scheduled for him when his discharge plan is determined.  CM to follow his hospital progress.

## 2014-12-06 NOTE — Progress Notes (Signed)
CM was made aware that patient was previously at [facility].  Per conversation with Dr Bevely Palmer, patient has been cleared by psychiatry and will not require a [facility] admission at discharge.  Dr Bevely Palmer feels that patient could potentially require physical rehab at the time of discharge.  CSW was notified of potential need.  Patient for surgery today.

## 2014-12-06 NOTE — Progress Notes (Signed)
No acute overnight events.  Ready for surgery.  No new questions AVSS Awake and alert, neurological exam unchanged. Patient with cervical myelopathy C3-4, 4-5 ACDF today.

## 2014-12-06 NOTE — Clinical Social Work Note (Signed)
CSW Consult Acknowledged:   CSW received a consult for SNF placement. CSW attempted to met with the pt at the bedside. CSW informed the pt went to the OR. CSW will attempt to met with the pt after surgery to discuss a discharge plan.    [place], MSW, [place]

## 2014-12-06 NOTE — Anesthesia Procedure Notes (Signed)
Procedure Name: Intubation Date/Time: 12/06/2014 9:44 AM Performed by: Charm Barges, Alece Koppel R Pre-anesthesia Checklist: Patient identified, Emergency Drugs available, Suction available, Patient being monitored and Timeout performed Patient Re-evaluated:Patient Re-evaluated prior to inductionOxygen Delivery Method: Circle system utilized Preoxygenation: Pre-oxygenation with 100% oxygen Intubation Type: IV induction Ventilation: Mask ventilation without difficulty Laryngoscope Size: Glidescope Grade View: Grade I Tube type: Oral Tube size: 8.0 mm Number of attempts: 1 Airway Equipment and Method: Rigid stylet and Video-laryngoscopy Placement Confirmation: ETT inserted through vocal cords under direct vision,  positive ETCO2 and breath sounds checked- equal and bilateral Secured at: 22 cm Tube secured with: Tape Dental Injury: Teeth and Oropharynx as per pre-operative assessment

## 2014-12-06 NOTE — Transfer of Care (Signed)
Immediate Anesthesia Transfer of Care Note  Patient: Darren Graham  Procedure(s) Performed: Procedure(s): Cervical three-four,Cervcial four-five Anterior cervical decompression/diskectomy/fusion with plate (N/A)  Patient Location: PACU  Anesthesia Type:General  Level of Consciousness: sedated  Airway & Oxygen Therapy: Patient Spontanous Breathing and Patient connected to nasal cannula oxygen  Post-op Assessment: Report given to RN, Post -op Vital signs reviewed and stable and Patient moving all extremities  Post vital signs: Reviewed and stable  Last Vitals:  Filed Vitals:   12/06/14 0549  BP: 96/66  Pulse: 68  Temp: 36.8 C  Resp: 20    Complications: No apparent anesthesia complications

## 2014-12-06 NOTE — Progress Notes (Signed)
Handoff to OR transport, pt in no acute distress. CHG completed, IV ATB handed off. Transported per bed.

## 2014-12-06 NOTE — Progress Notes (Signed)
PT REFUSED INITIATION OF PCA RX, ADVISED PT OF RISKS AND BENEFITS OF RX. STATED THAT HE IS PAIN CONTROLLED ON CURRENT REGIMEN. SPOKE WITH NEURO ON CALL, NEW ORDER FOR DC PCA.

## 2014-12-06 NOTE — Anesthesia Postprocedure Evaluation (Signed)
  Anesthesia Post-op Note  Patient: Darren Graham  Procedure(s) Performed: Procedure(s) (LRB): Cervical three-four,Cervcial four-five Anterior cervical decompression/diskectomy/fusion with plate (N/A)  Patient Location: PACU  Anesthesia Type: General  Level of Consciousness: awake and alert   Airway and Oxygen Therapy: Patient Spontanous Breathing  Post-op Pain: mild  Post-op Assessment: Post-op Vital signs reviewed, Patient's Cardiovascular Status Stable, Respiratory Function Stable, Patent Airway and No signs of Nausea or vomiting  Last Vitals:  Filed Vitals:   12/06/14 1430  BP: 149/73  Pulse: 89  Temp: 36.4 C  Resp: 13    Post-op Vital Signs: stable   Complications: No apparent anesthesia complications

## 2014-12-06 NOTE — Op Note (Signed)
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 While there he complained of greater than 1 month of progressive loss of function of his hands and shuffling gait. An MRI was performed which revealed cervical stenosis at C3-4 and C4-5 with spinal cord compression and myelomalacia C3-4 and C4-5. I had a long discussion about the risks and benefits of this operation, the alternatives to this operation and the risks and benefits of those, and he agreed to proceed. His psychiatric physicians assured me that he was competent to understand and make medical decisions.  Operative details: After smooth induction of general endotracheal anesthesia bolsters were placed beneath his neck, his head was placed in a stockinette, and his chin was taped with his neck in extended position. His skin was prepped in sterile drapes were placed. Fluoroscopy was used to localize the C4 vertebral body and the skin incision was made medial to the sternocleidomastoid. Dissection medial to the sternocleidomastoid allowed access to the prevertebral space. Neurovascular structures were carefully retracted laterally and the esophagus and airway retracted medially. The longus colli was dissected from the spine at C3-C4 and C5.  A table mounted retractor was inserted. Distraction pins were placed in C3-4 and a distraction device was opened. This level was ankylosed and distraction provided no movement. High-speed drill was used to access the disc space. Curettes were used to remove the small amount of disc material which remained.   The high speed drill was then used to complete bone removal to the level of the posterior longitudinal ligament.  This was opened sharply and the spinal canal was decompressed with Kerrison rongeurs.  The inferior surface of C3 and superior surface of C4 were decorticated. A 6mm PEEK spacer packed with Osteocel was inserted into the disc space.  The distraction pin at C3 was removed and was placed into the vertebral body of C5.  The annulus of C4-5 was incised and discectomy was performed.  The surfaces of the vertebral bodies were decorticated.  The posterior longitudinal ligament was open sharply and the spinal canal and exiting nerve roots were decompressed with Kerrison rongeurs.  A 7mm PEEK spacer packed with Osteocel was inserted into the disc space.  Distraction pins were removed.  A 40mm two level fixation plate was inserted and secured with 6 17mm screws.  The spacer clips were removed with a hemostat.  Meticulous hemostasis was obtained.  A medium hemovac drain was placed.  The wound was closed with interrupted vicryl sutures and the skin was sealed with Dermabond.  He was extubated without complication and transferred to PACU.

## 2014-12-07 ENCOUNTER — Telehealth: Payer: Self-pay

## 2014-12-07 ENCOUNTER — Encounter (HOSPITAL_COMMUNITY): Payer: Self-pay | Admitting: Neurological Surgery

## 2014-12-07 NOTE — Evaluation (Signed)
Physical Therapy Evaluation Patient Details Name: Darren Graham MRN: [id number] DOB: 15-Dec-1965 Today's Date: 12/07/2014   History of Present Illness  Patient is a 49 y/o male s/p C3-4, 4-5 ACDF. Recently discharged from inpatient psychiatry for substance abuse (ETOH). PMH includes alcohol abuse, cocaine abuse, fibromyalgia, arthritis.   Clinical Impression  Patient presents with generalized weakness Rt>lt, balance deficits and pain s/p above surgery impacting mobility. Education provided on cervical precautions. Pt with slow, guarded gait and unsteadiness noted during ambulation. Reluctant to use RW for support, may try [facility] next session. Requires use of IV pole for support. Encouraged ambulation multiple times per day to improve strength/mobility. Would benefit from 1 more night in hospital to improve functional mobility/safety as pt to be discharged to shelter. Will follow acutely.     Follow Up Recommendations Supervision for mobility/OOB    Equipment Recommendations  None recommended by PT    Recommendations for Other Services       Precautions / Restrictions Precautions Precautions: Cervical;Fall Restrictions Weight Bearing Restrictions: No      Mobility  Bed Mobility Overal bed mobility: Modified Independent             General bed mobility comments: Cues for log roll technique. No rails.  Transfers Overall transfer level: Needs assistance Equipment used: None Transfers: Sit to/from Stand Sit to Stand: Min guard         General transfer comment: Min guard for safety. Slow, guarded upon standing. Reaching for IV pole.  Ambulation/Gait Ambulation/Gait assistance: Min guard Ambulation Distance (Feet): 150 Feet Assistive device:  (IV pole.) Gait Pattern/deviations: Step-through pattern;Decreased stride length;Decreased dorsiflexion - right   Gait velocity interpretation: <1.8 ft/sec, indicative of risk for recurrent falls General Gait Details: Pt with slow,  guarded gait. Decreased foot clearance right foot. Use of IV pole for support.   Stairs            Wheelchair Mobility    Modified Rankin (Stroke Patients Only)       Balance Overall balance assessment: Needs assistance Sitting-balance support: Feet supported;No upper extremity supported Sitting balance-Leahy Scale: Fair     Standing balance support: During functional activity Standing balance-Leahy Scale: Fair Standing balance comment: Relient on IV pole for support.                              Pertinent Vitals/Pain Pain Assessment: 0-10 Pain Score: 6  Pain Location: neck at surgical site Pain Descriptors / Indicators: Sore Pain Intervention(s): Repositioned;Monitored during session    Home Living Family/patient expects to be discharged to:: Shelter/Homeless Living Arrangements: Other (Comment)                    Prior Function Level of Independence: Independent         Comments: Pt reports some difficulty with walking PTA.      Hand Dominance        Extremity/Trunk Assessment   Upper Extremity Assessment: Defer to OT evaluation           Lower Extremity Assessment: Generalized weakness;RLE deficits/detail RLE Deficits / Details: Decreased foot clearance RLE during gait.        Communication   Communication: No difficulties  Cognition Arousal/Alertness: Awake/alert Behavior During Therapy: WFL for tasks assessed/performed Overall Cognitive Status: Within Functional Limits for tasks assessed  General Comments      Exercises        Assessment/Plan    PT Assessment Patient needs continued PT services  PT Diagnosis Difficulty walking;Generalized weakness;Acute pain   PT Problem List Decreased strength;Pain;Impaired sensation;Decreased activity tolerance;Decreased balance;Decreased mobility;Decreased knowledge of precautions  PT Treatment Interventions Balance training;Gait  training;Functional mobility training;Therapeutic activities;Therapeutic exercise;Patient/family education   PT Goals (Current goals can be found in the Care Plan section) Acute Rehab PT Goals Patient Stated Goal: to walk better PT Goal Formulation: With patient Time For Goal Achievement: 12/21/14 Potential to Achieve Goals: Fair    Frequency Min 3X/week   Barriers to discharge Decreased caregiver support      Co-evaluation               End of Session Equipment Utilized During Treatment: Gait belt Activity Tolerance: Patient tolerated treatment well Patient left: in chair;with call bell/phone within reach Nurse Communication: Mobility status         Time: 1120-1140 PT Time Calculation (min) (ACUTE ONLY): 20 min   Charges:   PT Evaluation $Initial PT Evaluation Tier I: 1 Procedure     PT G Codes:        Remmy Riffe A Aloysius Heinle 12/07/2014, 11:50 AM  Mylo Red, PT, DPT [phone]

## 2014-12-07 NOTE — Clinical Social Work Note (Signed)
Clinical Social Work Assessment  Patient Details  Name: Darren Graham MRN: [id number] Date of Birth: 1965-12-26  Date of referral:  12/06/14               Reason for consult:  Housing Concerns/Homelessness              Housing/Transportation Living arrangements for the past 2 months:  Homeless Source of Information:  Patient Patient Interpreter Needed:  None Criminal Activity/Legal Involvement Pertinent to Current Situation/Hospitalization:  No - Comment as needed Significant Relationships:  Friend Lives with:  Other (Comment) (Homeless) Do you feel safe going back to the place where you live?  Yes Need for family participation in patient care:  No (Coment)  Care giving concerns:  None    Facilities manager / plan:  CSW met pt at bedside. CSW introduce self and purpose of visit. CSW discussed [place]: Burnetta Sabin, Solicitor and [facility]) and the services they offer. CSW provided the pt with the requirement for [employer] and [employer]. CSW provided the pt with the contract information for both [employer], [employer] and [employer].  At this time there are no additional identified needs. CSW will sign off.   Employment status:  Unemployed Forensic scientist:  Self Pay (Medicaid Pending) Information / Referral to community resources:  Shelter  Patient/Family's Response to care:  Pt reported the care in which he received has been good.   Patient/Family's Understanding of and Emotional Response to Diagnosis, Current Treatment, and Prognosis:  Pt acknowledged his diagnosis and treatment plan.   Emotional Assessment Appearance:  Appears stated age Attitude/Demeanor/Rapport:   (Calm ) Affect (typically observed):  Accepting Orientation:  Oriented to Self, Oriented to Place, Oriented to  Time, Oriented to Situation Alcohol / Substance use:  Not Applicable Psych involvement (Current and /or in the community):  No  (Comment)  Discharge Needs  Concerns to be addressed:  Homelessness Readmission within the last 30 days:  Yes Current discharge risk:  Homeless Barriers to Discharge:  No Barriers Identified   Posey Jasmin, LCSW 12/07/2014, 12:15 PM

## 2014-12-07 NOTE — Care Management Note (Signed)
Case Management Note  Patient Details  Name: Darren Graham MRN: [id number] Date of Birth: [birth year]  Subjective/Objective:                    Action/Plan: CM spoke with Dr Cyndy Freeze regarding discharge disposition.  Patient is homeless, but per MD doing well after surgery.  Awaiting PT/OT evals to determine discharge needs. Dr Cyndy Freeze is aware that home health cannot be ordered as patient is uninsured, lacks a qualifying diagnosis and does not have an address.  CM met with patient to provide homeless resources.  Additionally, CM has reached out to [facility] with the [facility] to assist with establishing a PCP.  Appointment was made for 12/15/14 at 11:30.  This was discussed with patient, who is aware that he has the ability to use the [facility] pharmacy, but must keep his appointment in order to do so in the future.  Patient is also aware that he will likely not receive assistance with any chronic pain medications.  CM will continue to follow regarding discharge needs and will call Dr Cyndy Freeze with PT/OT recommendations when available, per his request.    Expected Discharge Date:                  Expected Discharge Plan:  Homeless Shelter  In-House Referral:     Discharge planning Services  CM Consult  Post Acute Care Choice:    Choice offered to:     DME Arranged:    DME Agency:     HH Arranged:    Max Agency:     Status of Service:  In process, will continue to follow  Medicare Important Message Given:    Date Medicare IM Given:    Medicare IM give by:    Date Additional Medicare IM Given:    Additional Medicare Important Message give by:     If discussed at [place] of Stay Meetings, dates discussed:    Additional Comments:  Rolm Baptise, RN 12/07/2014, 10:56 AM

## 2014-12-07 NOTE — Progress Notes (Signed)
No acute events AVSS Motor 5/5 throughout Incision c/d/i, neck flat Voice normal D/c drain PT/OT Placement

## 2014-12-07 NOTE — Telephone Encounter (Signed)
Call received from [facility], CM noting the patient may be discharged today.  A hospital follow up appointment was scheduled for 12/15/14  at the [facility] clinic.  The information was added to the AVS and C. Robarge, CM was updated.

## 2014-12-07 NOTE — Progress Notes (Signed)
Utilization review completed. Aritzel Krusemark, RN, BSN. 

## 2014-12-08 MED ORDER — HYDROCODONE-ACETAMINOPHEN 7.5-325 MG PO TABS: 1.0000 | ORAL_TABLET | Freq: Four times a day (QID) | ORAL | Status: AC | PRN

## 2014-12-08 MED ORDER — METHOCARBAMOL 750 MG PO TABS: 750.0000 mg | ORAL_TABLET | Freq: Four times a day (QID) | ORAL | Status: AC

## 2014-12-08 NOTE — Progress Notes (Signed)
Pt insisting to go off the unit to go outside. Pt stated, " I just want to go outside to get some fresh air." He was encouraged not to go but he went anyway. Pt did not have an order to leave off the unit and was told that Md would have to be notified. Security was called to assist him back to the unit. No behavior issues. Pt stable, no noted distress. Surgical gauze dressing clean, dry and intact. Pt denied pain or discomfort. During rounds Md was informed and received a verbal order that pt is able to leave off the unit without assist to get some fresh air until he is discharged some time today. Will continue to monitor.

## 2014-12-08 NOTE — Discharge Summary (Signed)
Physician Discharge Summary Note  Patient:  Darren Graham is an 49 y.o., male MRN:  [id number] DOB:  1966-01-20 Patient phone:  [phone] (home)  Patient address:   [address] [place] [address],  Total Time spent with patient: 45 minutes  Date of Admission:  11/27/2014 Date of Discharge: 12/05/2014  Reason for Admission:  Alcohol intoxication  Principal Problem: Alcohol use disorder, severe, dependence Discharge Diagnoses: Patient Active Problem List   Diagnosis Date Noted  . Spondylosis, cervical, with myelopathy [M47.12] 12/05/2014  . Cervical spondylosis with myelopathy [M47.12] 12/05/2014  . Substance induced mood disorder [F19.94] 11/27/2014  . Alcohol use disorder, severe, dependence [F10.20] 11/27/2014  . Cocaine use disorder, moderate, dependence [F14.20] 11/27/2014    Musculoskeletal: Strength & Muscle Tone: within normal limits Gait & Station: normal Patient leans: N/A  Psychiatric Specialty Exam: Physical Exam  Vitals reviewed.   ROS  Blood pressure 114/78, pulse 90, temperature 98.6 F (37 C), temperature source Oral, resp. rate 18, height  (1.676 m), weight 69.4 kg (153 lb).Body mass index is 24.71 kg/(m^2).   General Appearance: Casualneat  Eye Contact:: Good  Speech: Clear and Coherent intact  Volume: Normal  Mood: Euthymic  Affect: Appropriate  Thought Process: Coherent  Orientation: Full (Time, Place, and Person)  Thought Content: WDL  Suicidal Thoughts: No  Homicidal Thoughts: No  Memory: Immediate; Fair Recent; Fair Remote; Fair  Judgement: Fair  Insight: Fair  Psychomotor Activity: Normal  Concentration: Fair  Recall: [employer] of Knowledge:Fair  Language: Fair  Akathisia: No  Handed: Right  AIMS (if indicated):    Assets: Communication Skills Desire for Improvement  Sleep: Number of Hours: 6  Cognition: WNL  ADL's: Intact       Have you used any form of  tobacco in the last 30 days? (Cigarettes, Smokeless Tobacco, Cigars, and/or Pipes): Yes  Has this patient used any form of tobacco in the last 30 days? (Cigarettes, Smokeless Tobacco, Cigars, and/or Pipes) N/A  Past Medical History:  Past Medical History  Diagnosis Date  . Arthritis   . Fibromyalgia     Past Surgical History  Procedure Laterality Date  . Anterior cervical decomp/discectomy fusion N/A 12/06/2014    Procedure: Cervical three-four,Cervcial four-five Anterior cervical decompression/diskectomy/fusion with plate;  Surgeon: Loura Halt Ditty, MD;  Location: [facility];  Service: Neurosurgery;  Laterality: N/A;   Family History: History reviewed. No pertinent family history. Social History:  History  Alcohol Use  . Yes    Comment: 8-10 40 ounces daily     History  Drug Use  . Yes  . Special: Cocaine    Comment: $50-$60 sporadic    Social History   Social History  . Marital Status: Single    Spouse Name: N/A  . Number of Children: N/A  . Years of Education: N/A   Social History Main Topics  . Smoking status: Current Every Day Smoker -- 0.50 packs/day for 35 years    Types: Cigarettes  . Smokeless tobacco: None  . Alcohol Use: Yes     Comment: 8-10 40 ounces daily  . Drug Use: Yes    Special: Cocaine     Comment: $50-$60 sporadic  . Sexual Activity: Yes    Birth Control/ Protection: Condom   Other Topics Concern  . None   Social History Narrative    Risk to Self: Is patient at risk for suicide?: Yes What has been your use of drugs/alcohol within the last 12 months?:  Pt is using alcohol daily and crack cocaine 1-2 times a month Risk to Others:   Prior Inpatient Therapy:   Prior Outpatient Therapy:    Level of Care:  OP  Hospital Course:  Carmin Dibartolo was admitted for Alcohol use disorder, severe, dependence and crisis management.  He was treated discharged with the medications listed below under Medication List.  Medical problems were identified  and treated as needed.  Home medications were restarted as appropriate.  Improvement was monitored by observation and Doran Durand daily report of symptom reduction.  Emotional and mental status was monitored by daily self-inventory reports completed by Doran Durand and clinical staff.         Rasheen Bells was evaluated by the treatment team for stability and plans for continued recovery upon discharge.  Per Beagley motivation was an integral factor for scheduling further treatment.  Employment, transportation, bed availability, health status, family support, and any pending legal issues were also considered during his hospital stay.  He was offered further treatment options upon discharge including but not limited to Residential, Intensive Outpatient, and Outpatient treatment.  Jahzeel Poythress will follow up with the services as listed below under Follow Up Information.     Upon completion of this admission the patient was both mentally and medically stable for discharge denying suicidal/homicidal ideation, auditory/visual/tactile hallucinations, delusional thoughts and paranoia.      Consults:  psychiatry  Significant Diagnostic Studies:  labs: per ED  Discharge Vitals:   Blood pressure 114/78, pulse 90, temperature 98.6 F (37 C), temperature source Oral, resp. rate 18, height 5\' 6"  (1.676 m), weight 69.4 kg (153 lb). Body mass index is 24.71 kg/(m^2). Lab Results:   No results found for this or any previous visit (from the past 72 hour(s)).  Physical Findings: AIMS: Facial and Oral Movements Muscles of Facial Expression: None, normal Lips and Perioral Area: None, normal Jaw: None, normal Tongue: None, normal,Extremity Movements Upper (arms, wrists, hands, fingers): None, normal Lower (legs, knees, ankles, toes): None, normal, Trunk Movements Neck, shoulders, hips: None, normal, Overall Severity Severity of abnormal movements (highest score from questions above): None,  normal Incapacitation due to abnormal movements: None, normal Patient's awareness of abnormal movements (rate only patient's report): No Awareness, Dental Status Current problems with teeth and/or dentures?: No Does patient usually wear dentures?: No  CIWA:  CIWA-Ar Total: 0 COWS:      See Psychiatric Specialty Exam and Suicide Risk Assessment completed by Attending Physician prior to discharge.  Discharge destination:  Home  Is patient on multiple antipsychotic therapies at discharge:  No   Has Patient had three or more failed trials of antipsychotic monotherapy by history:  No    Recommended Plan for Multiple Antipsychotic Therapies: NA     Medication List    STOP taking these medications        acetaminophen 650 MG CR tablet  Commonly known as:  TYLENOL      TAKE these medications      Indication   FLUoxetine 10 MG capsule  Commonly known as:  PROZAC  Take 3 capsules (30 mg total) by mouth daily.   Indication:  Major Depressive Disorder     gabapentin 100 MG capsule  Commonly known as:  NEURONTIN  Take 2 capsules (200 mg total) by mouth 3 (three) times daily.   Indication:  Agitation, Neuropathic Pain, Pain     meloxicam 15 MG tablet  Commonly known as:  MOBIC  Take 1 tablet (15 mg total)  by mouth daily.   Indication:  Joint Damage causing Pain and Loss of Function     traZODone 50 MG tablet  Commonly known as:  DESYREL  Take 1 tablet (50 mg total) by mouth at bedtime as needed (May repeat x1).   Indication:  Trouble Sleeping           Follow-up Information    Follow up with [facility].   Specialty:  Behavioral Health   Why:  Walk-in clinic Monday-Friday between 8am to 3pm for assessment for therapy and medication management services.    [address] [place] [address] [phone]       Follow-up recommendations:  Activity:  as tol, diet as tol  Comments:  1.  Take all your medications as prescribed.              2.   Report any adverse side effects to outpatient provider.                       3.  Patient instructed to not use alcohol or illegal drugs while on prescription medicines.            4.  In the event of worsening symptoms, instructed patient to call 911, the crisis hotline or go to nearest emergency room for evaluation of symptoms.  Total Discharge Time: 40 min  Signed: Velna Hatchet May Brenley Priore AGNP-BC 12/08/2014, 9:26 AM

## 2014-12-08 NOTE — Progress Notes (Signed)
No acute events AVSS Awake and alert Motor 5/5 Incision c/d/i with dermabond Ready for discharge

## 2014-12-08 NOTE — Care Management Note (Signed)
Case Management Note  Patient Details  Name: Darren Graham MRN: [id number] Date of Birth: 02/15/1966  Subjective/Objective:                    Action/Plan: Met with patient to provide [facility] letter for prescriptions provided by Dr Cyndy Freeze.  Patient has been unable to provide any contact information for friends/family for CM/CSW to contact regarding discharge transportation arrangements.  CSW following and addressing transportation concerns.  Bedside RN updated.  Expected Discharge Date:                  Expected Discharge Plan:  Homeless Shelter  In-House Referral:  Development worker, community, Clinical Social Work  Discharge planning Services  CM Consult, [facility], Follow-up appt scheduled, [facility]  Post Acute Care Choice:    Choice offered to:     DME Arranged:    DME Agency:     HH Arranged:    [place] Agency:     Status of Service:  Completed, signed off  Medicare Important Message Given:    Date Medicare IM Given:    Medicare IM give by:    Date Additional Medicare IM Given:    Additional Medicare Important Message give by:     If discussed at [place] of Stay Meetings, dates discussed:    Additional Comments:  Rolm Baptise, RN 12/08/2014, 3:01 PM

## 2014-12-08 NOTE — Progress Notes (Signed)
CM met with patient to discuss discharge planning. Patient states that he now plans to discharge home with friends/family in [place], [place].  He reports that he has no means of transportation and that he will need the hospital to arrange it.  CM discussed the possibility of having someone come from [place] come pick him up.  Patient states that no one would be able to do this.  CM informed patient that the CSW will be contacted to see if any transportation assistance can be provided, but there is no promise that anything can be arranged.  Patient stated on previous encounter that he does not have resources for his medication at discharge.  CM will provide [place] letter, as appropriate.  Once discharge disposition is finalized, CM will cancel the appointment at the [facility]), if needed.  CM will discuss surgical follow-up with Dr Cyndy Freeze, as patient will likely not come back to [facility] for post-operative follow-up.  Bedside RN is aware of potential change in discharge plan.

## 2014-12-08 NOTE — Progress Notes (Signed)
Pt was made aware of safety concerns related to leaving the unit unattended. Pt is alert and oriented with verbal understanding.

## 2014-12-08 NOTE — Evaluation (Signed)
Occupational Therapy Evaluation Patient Details Name: Markos Theil MRN: [id number] DOB: 06-Aug-1965 Today's Date: 12/08/2014    History of Present Illness Patient is a 49 y/o male s/p C3-4, 4-5 ACDF. Recently discharged from inpatient psychiatry for substance abuse (ETOH). PMH includes alcohol abuse, cocaine abuse, fibromyalgia, arthritis.    Clinical Impression   Patient presents with generalized weakness and pain s/p above surgery impacting functional mobility and participation in ADLs.  Education and handout provided on cervical precautions in functional tasks.  Utilized [facility] for mobility in room and around unit with pt reporting increased stability and confidence.  Pt reports concerns with d/c plan and current level of pain.  Pt to be d/c today therefore no recommendation for follow up at this time.    Follow Up Recommendations  No OT follow up    Equipment Recommendations  None recommended by OT    Recommendations for Other Services       Precautions / Restrictions Precautions Precautions: Cervical;Fall Restrictions Weight Bearing Restrictions: No      Mobility Bed Mobility                  Transfers Overall transfer level: Modified independent Equipment used: Straight cane Transfers: Sit to/from Stand Sit to Stand: Modified independent (Device/Increase time)         General transfer comment: Slow, guarded upon standing without AD, pt reports more confident with SPC.         ADL Overall ADL's : Independent                                             Vision Vision Assessment?: No apparent visual deficits          Pertinent Vitals/Pain Pain Assessment: 0-10 Pain Score: 6  Pain Location: neck at surgical site Pain Descriptors / Indicators: Discomfort;Sore Pain Intervention(s): Monitored during session;Premedicated before session     Hand Dominance Right   Extremity/Trunk Assessment Upper Extremity Assessment Upper  Extremity Assessment: Overall WFL for tasks assessed   Lower Extremity Assessment Lower Extremity Assessment: Generalized weakness;RLE deficits/detail RLE Deficits / Details: Decreased foot clearance RLE during gait, pt reports this is premorbid       Communication Communication Communication: No difficulties   Cognition Arousal/Alertness: Awake/alert Behavior During Therapy: WFL for tasks assessed/performed Overall Cognitive Status: Within Functional Limits for tasks assessed                                Home Living Family/patient expects to be discharged to:: Shelter/Homeless Living Arrangements: Other (Comment)                               Additional Comments: Pt reports he has friends in [place] he could stay with but CM having difficulty contacing.  Pt reports that he does not want to return to a shelter "like this".      Prior Functioning/Environment Level of Independence: Independent        Comments: Pt reports some difficulty with walking PTA.     OT Diagnosis: Generalized weakness;Acute pain   OT Problem List: Decreased range of motion;Pain   OT Treatment/Interventions:      OT Goals(Current goals can be found in the care plan section) Acute Rehab OT Goals Patient Stated  Goal: to no longer have pain OT Goal Formulation: With patient Potential to Achieve Goals: Good     Barriers to D/C:         End of Session Equipment Utilized During Treatment: Gait belt;Other (comment) [facility]) Nurse Communication: Mobility status;Patient requests pain meds  Activity Tolerance: Patient tolerated treatment well Patient left: in chair;with call bell/phone within reach   Time: 4098-1191 OT Time Calculation (min): 20 min Charges:  OT General Charges $OT Visit: 1 Procedure OT Evaluation $Initial OT Evaluation Tier I: 1 Procedure G-CodesRosalio Loud [phone] 12/08/2014, 2:00 PM

## 2014-12-08 NOTE — Progress Notes (Signed)
Pt discharged at this time taking all personal belongings. IV discontinued, dry dressing applied. Discharge paperwork provided to pt during downtime with verbal understanding. Pt was provided x3 transportation  vouchers. Pt was transported to outpatient pharmacy via security. No noted distress. Surgical site clean and dry.

## 2014-12-08 NOTE — Discharge Summary (Signed)
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 Maciolek was admitted to the hospital the day prior to surgery. On hospital day 2 he was taken to the operating room for the above listed operation. He tolerated this well was stable and discharged from the operating room to the recovery area and into the floor. He had an uneventful postoperative course. He is discharged home in stable medical neurological condition.  Discharge medications: He will resume his home. He will take Norco for pain and Robaxin for muscle relaxation.  Follow-up: With me in neurosurgery clinic in 8 days if possible otherwise in 3 and 9 months.

## 2014-12-08 NOTE — Progress Notes (Signed)
Physical Therapy Treatment Patient Details Name: Darren Graham MRN: [id number] DOB: 26-Jul-1965 Today's Date: 12/08/2014    History of Present Illness Patient is a 49 y/o male s/p C3-4, 4-5 ACDF. Recently discharged from inpatient psychiatry for substance abuse (ETOH). PMH includes alcohol abuse, cocaine abuse, fibromyalgia, arthritis.     PT Comments    Progressing well towards physical therapy goals. Ambulating further distances, safely navigates stairs, and transfers without assistance. Demonstrates poor right foot clearance due to hip pain (reports hx of hip OA). Would benefit from cane for increased safety with gait. Adequate for d/c from PT standpoint.  Follow Up Recommendations  Supervision for mobility/OOB     Equipment Recommendations  Cane    Recommendations for Other Services       Precautions / Restrictions Precautions Precautions: Cervical;Fall Restrictions Weight Bearing Restrictions: No    Mobility  Bed Mobility               General bed mobility comments: sitting in chair  Transfers Overall transfer level: Modified independent Equipment used: Straight cane Transfers: Sit to/from Stand Sit to Stand: Modified independent (Device/Increase time)         General transfer comment: Slow, guarded upon standing without AD, pt reports more confident with SPC.  Ambulation/Gait Ambulation/Gait assistance: Supervision Ambulation Distance (Feet): 200 Feet Assistive device: None Gait Pattern/deviations: Step-through pattern (decreased Rt foot clearance/hip flexion) Gait velocity: decreased   General Gait Details: Drags right foot at times. Cues for greater knee flexion during swing limb advancement which greatly improves dynamics. Very guarded with mild sway noted. No overt loss of balance noted. Able to step backwards without loss of balance.   Stairs Stairs: Yes Stairs assistance: Supervision Stair Management: One rail Left;Step to  pattern;Forwards Number of Stairs: 2 General stair comments: Cues for sequencing. Cautious and slow but performed safely without assistance.  Wheelchair Mobility    Modified Rankin (Stroke Patients Only)       Balance                                    Cognition Arousal/Alertness: Awake/alert Behavior During Therapy: WFL for tasks assessed/performed Overall Cognitive Status: Within Functional Limits for tasks assessed                      Exercises      General Comments        Pertinent Vitals/Pain Pain Assessment: 0-10 Pain Score: 6  Faces Pain Scale: Hurts little more Pain Location: neck at surgical site Pain Descriptors / Indicators: Discomfort;Sore Pain Intervention(s): Monitored during session;Premedicated before session    Home Living Family/patient expects to be discharged to:: Shelter/Homeless Living Arrangements: Other (Comment)             Additional Comments: Pt reports he has friends in [place] he could stay with but CM having difficulty contacing.  Pt reports that he does not want to return to a shelter "like this".    Prior Function Level of Independence: Independent      Comments: Pt reports some difficulty with walking PTA.    PT Goals (current goals can now be found in the care plan section) Acute Rehab PT Goals Patient Stated Goal: to no longer have pain PT Goal Formulation: With patient Time For Goal Achievement: 12/21/14 Potential to Achieve Goals: Fair Progress towards PT goals: Progressing toward goals    Frequency  Min 3X/week  PT Plan Equipment recommendations need to be updated    Co-evaluation             End of Session   Activity Tolerance: Patient tolerated treatment well Patient left: in chair;with call bell/phone within reach     Time: 1143-1152 PT Time Calculation (min) (ACUTE ONLY): 9 min  Charges:  $Gait Training: 8-22 mins                    G Codes:      Berton Mount 12-21-14, 2:04 PM [place], [place] [phone]

## 2014-12-09 ENCOUNTER — Encounter (HOSPITAL_COMMUNITY): Payer: Self-pay | Admitting: Neurological Surgery

## 2014-12-15 ENCOUNTER — Ambulatory Visit: Payer: Self-pay | Admitting: Family Medicine

## 2016-01-25 IMAGING — MR MR CERVICAL SPINE W/O CM
10 of 21 series · 19 of 48 positions shown · non-contrast
Comparison: [REDACTED] head CT without contrast
06/10/2012, and cervical spine CT 01/09/2012

[HOSPITAL] chest radiographs 11/30/2014.

CLINICAL DATA: 49-year-old male with visual changes, confusion,
nausea, unsteady gait, right upper and lower extremity tingling and
weakness. Symptoms for 1 month. Initial encounter.

EXAM:
MRI HEAD WITHOUT CONTRAST
MRI CERVICAL SPINE WITHOUT CONTRAST
MRI THORACIC SPINE WITHOUT CONTRAST
TECHNIQUE: Multiplanar, multiecho pulse sequences of the brain and surrounding
structures, and cervical spine and thoracic spine were obtained
without intravenous contrast.

[Series 3: T1 · sagittal · 5.0mm · 0.47mm/px · 1 of 24 slices shown (1 of 3)]
[im 1/24]
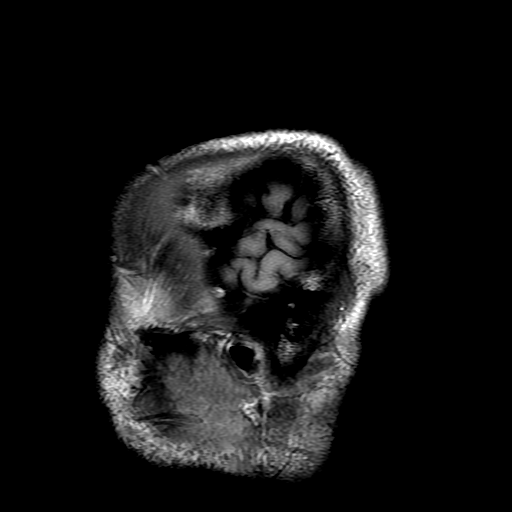

[Series 4: DWI · axial · 3.0mm · 1.09mm/px · z∈[-42,+73]mm · 5 of 100 slices shown]
[im 1/100]
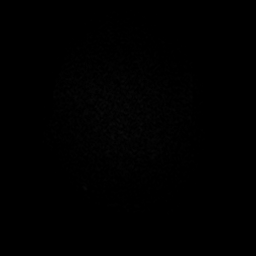
[im 20/100]
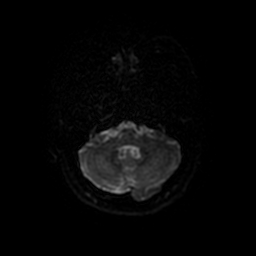
[im 40/100]
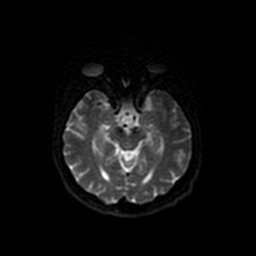
[im 60/100]
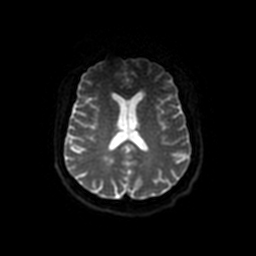
[im 80/100]
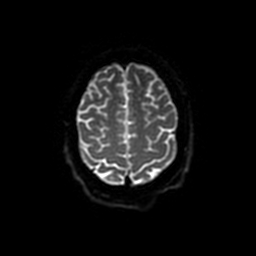

[Series 6: T2 · axial · 5.0mm · 0.43mm/px · z∈[-41,+106]mm · 2 of 24 slices shown (1 of 3)]
[im 1/24]
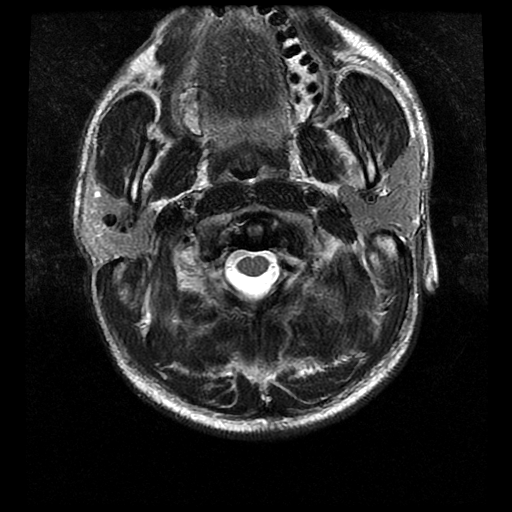
[im 24/24]
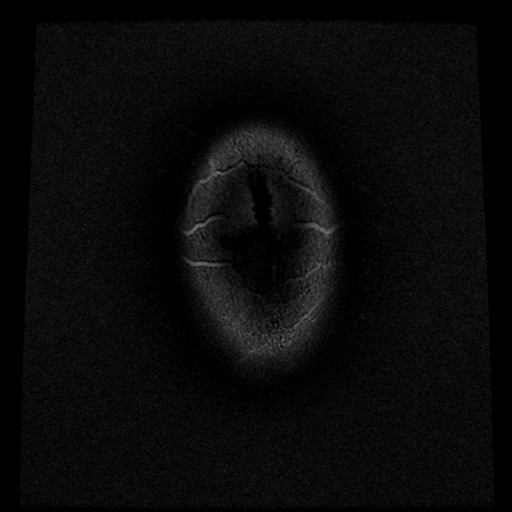

[Series 10: T2 post-contrast · coronal · 5.0mm · 0.45mm/px · 2 of 27 slices shown (1 of 3)]
[im 1/27]
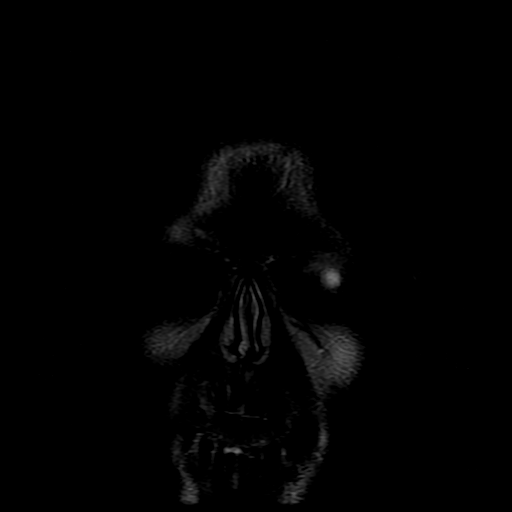
[im 27/27]
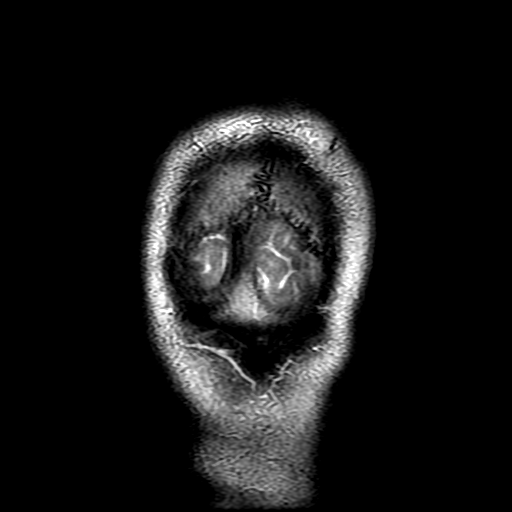

[Series 13: T1 · sagittal · 3.0mm · 0.41mm/px · 1 of 12 slices shown (2 of 3)]
[im 1/12]
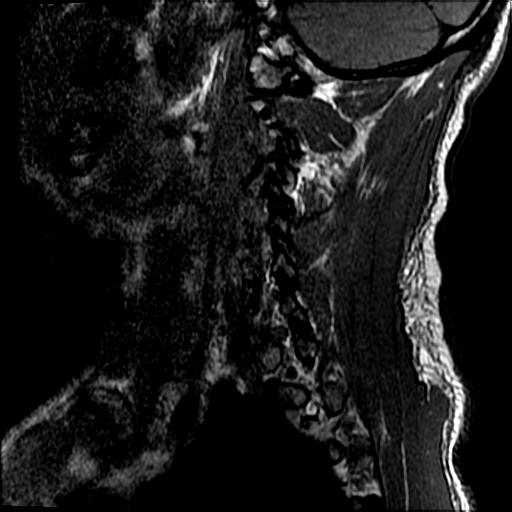

[Series 15: T2 post-contrast · sagittal · 3.0mm · 0.41mm/px · 1 of 12 slices shown (2 of 3)]
[im 1/12]
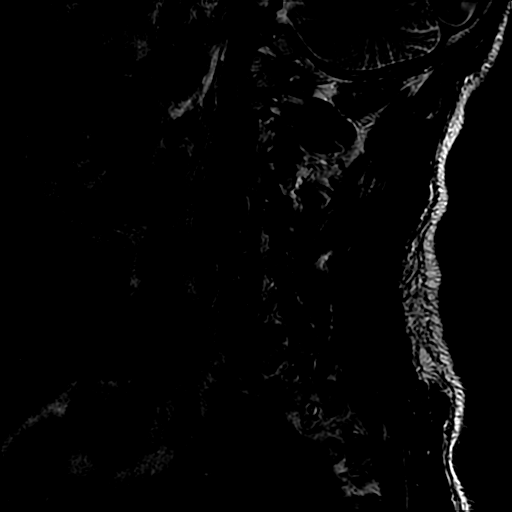

[Series 17: T2 · axial · 3.1mm · 0.35mm/px · z∈[-169,-77]mm · 2 of 29 slices shown (2 of 3)]
[im 1/29]
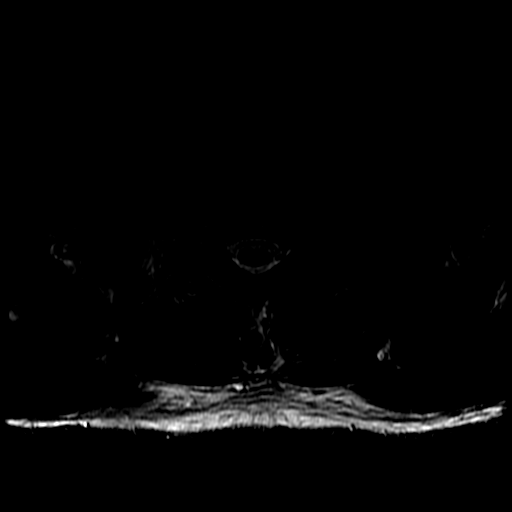
[im 29/29]
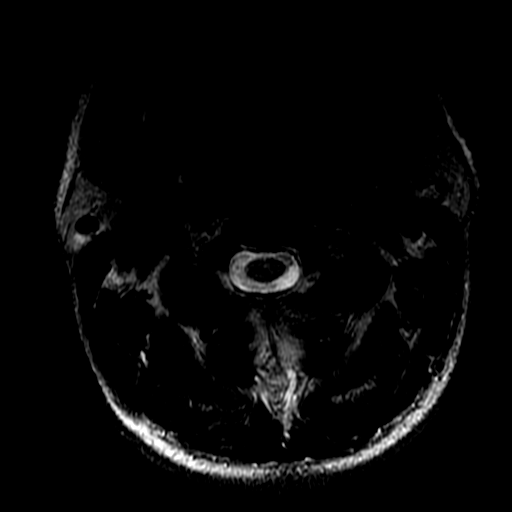

[Series 22: T1 · sagittal · 3.0mm · 0.66mm/px · 1 of 14 slices shown (3 of 3)]
[im 1/14]
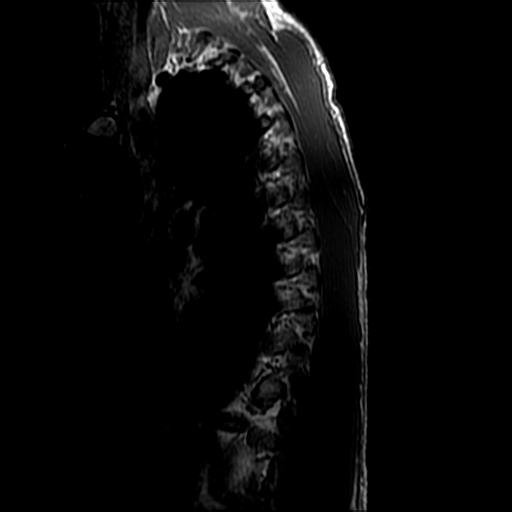

[Series 24: T2 post-contrast · sagittal · 3.0mm · 0.66mm/px · 1 of 14 slices shown (3 of 3)]
[im 1/14]
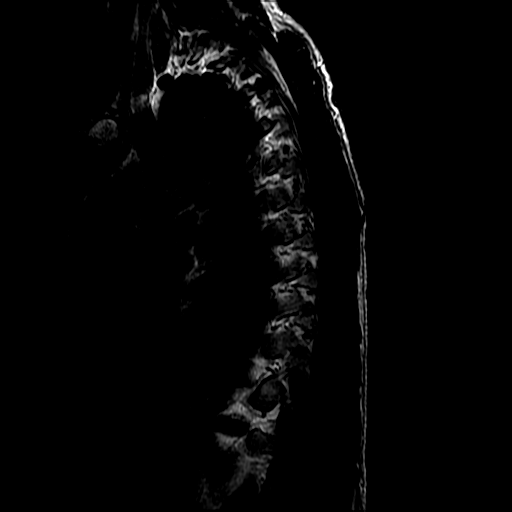

[Series 25: T2 · axial · 4.0mm · 0.39mm/px · z∈[-400,-156]mm · 3 of 39 slices shown (3 of 3)]
[im 1/39]
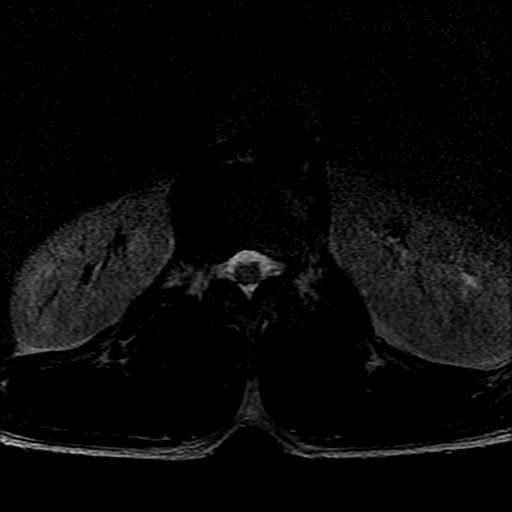
[im 20/39]
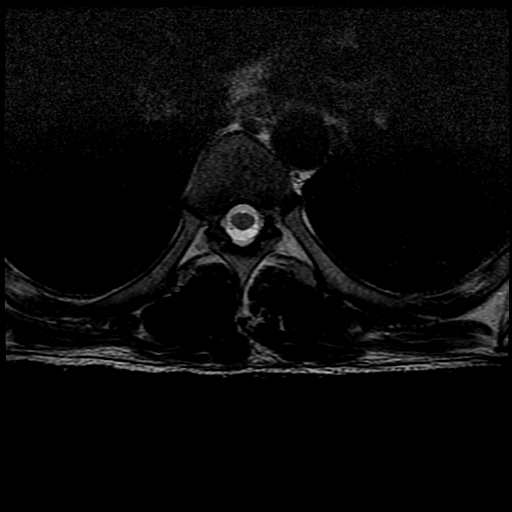
[im 39/39]
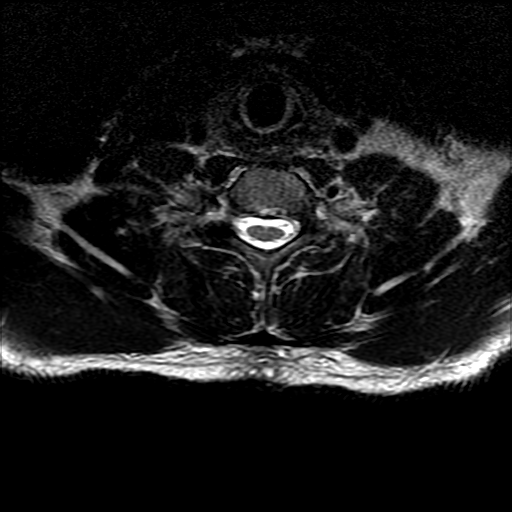

[19 of 48 positions shown; findings below may reference images not displayed]

FINDINGS: MRI HEAD FINDINGS

Cerebral volume is normal. No restricted diffusion to suggest acute
infarction. No midline shift, mass effect, evidence of mass lesion,
ventriculomegaly, extra-axial collection or acute intracranial
hemorrhage. Cervicomedullary junction and pituitary are within
normal limits. Major intracranial vascular flow voids are within
normal limits.

Scattered small, occasionally patchy, cerebral white matter T2 and
FLAIR hyperintensity. The configuration is nonspecific. The extent
is moderate for age. No cortical encephalomalacia or chronic
cerebral blood products. Deep gray matter nuclei, brainstem, and
cerebellum are within normal limits.

Visible internal auditory structures appear normal. Paranasal
sinuses and mastoids are clear. Negative orbit and scalp soft
tissues. Normal bone marrow signal.

MRI CERVICAL SPINE FINDINGS

Stable cervical vertebral height and alignment since 9715.
Multilevel degenerative endplate marrow signal changes. Superimposed
degenerative appearing mild marrow edema at both the C3-C4 and C4-C5
endplates. No convincing acute osseous abnormality.

Non dominant, diminutive right vertebral artery in the neck.
Negative paraspinal soft tissues.

Cervicomedullary junction is within normal limits. There is abnormal
spinal cord signal at both the C3-C4 and C4-C5 levels (series 15,
image 5), where chronic spinal stenosis has been demonstrated since
9715.

C2-C3: Mild disc bulge. Mild facet hypertrophy greater on the right.
No significant stenosis.

C3-C4: Chronic disc space loss and circumferential disc osteophyte
complex. Chronic ligament flavum hypertrophy. Uncovertebral
hypertrophy. Chronic moderate spinal stenosis with moderate to
severe spinal cord mass effect and abnormal central mostly gray
matter cord signal. Moderate to severe bilateral C4 foraminal
stenosis also is chronic.

C4-C5: Chronic disc space loss and circumferential disc osteophyte
complex with broad-based posterior component of disc best
demonstrated on series 17, image 12. Mild ligament flavum
hypertrophy. Chronic spinal stenosis, moderate with moderate to
severe spinal cord mass effect in abnormal right greater than left
hemi cord T2 hyperintensity (series 17, image 13). Moderate to
severe bilateral C5 foraminal stenosis.

C5-C6: Disc space loss. Left eccentric circumferential disc
osteophyte complex. Spinal stenosis with mild if any spinal cord
mass effect on the left. Left greater than right uncovertebral
hypertrophy. Moderate to severe left and mild right C6 foraminal
stenosis.

C6-C7: Disc space loss. Circumferential disc osteophyte complex and
uncovertebral hypertrophy. Borderline to mild spinal stenosis, no
cord mass effect. Mild left C7 foraminal stenosis.

C7-T1: Circumferential disc osteophyte complex with uncovertebral
and facet hypertrophy. No spinal stenosis. Severe left and mild
right C8 foraminal stenosis.

MRI THORACIC SPINE FINDINGS

Mild dextro convex thoracic scoliosis. Trace anterolisthesis of T2
on T3 and T3 on T4. Otherwise thoracic vertebral height and
alignment is within normal limits. No marrow edema or evidence of
acute osseous abnormality.

Partially visible left upper pole renal cyst, appears simple and
benign. Negative visualized thoracic viscera. Negative visualized
posterior paraspinal soft tissues.

T1-T2: Moderate facet hypertrophy. Right greater than left
uncovertebral hypertrophy and disc bulge. No spinal stenosis, but
there is mild left and moderate to severe right T1 foraminal
stenosis.

T2-T3: Mild circumferential disc osteophyte complex. Moderate facet
hypertrophy greater on the right. Mild left and moderate to severe
right T2 foraminal stenosis.

T3-T4: Moderate facet hypertrophy. Mild right greater than left T3
foraminal stenosis.

T4-T5: Mild facet hypertrophy. Borderline to mild T4 foraminal
stenosis.

T5-T6: Mild facet hypertrophy.  Mild left T5 foraminal stenosis.

T6-T7: Borderline to mild facet hypertrophy.  No stenosis.

T7-T8: Mild endplate degeneration. Mild facet hypertrophy. Mild
leftward disc bulge. No stenosis.

T8-T9: Mild facet hypertrophy.  No stenosis.

T9-T10: Mild disc bulge and facet hypertrophy. Mild-to-moderate
bilateral T9 foraminal stenosis.

T10-T11: Mild disc bulge. Moderate facet hypertrophy. No significant
spinal stenosis. Moderate bilateral T10 foraminal stenosis.

T11-T12: Far lateral disc osteophyte complex. Mild facet
hypertrophy. No significant stenosis.

T12-L1: Far lateral disc bulging. Mild facet hypertrophy. No
significant stenosis.

Normal thoracic spinal cord signal. The conus medullaris occurs in
the lumbar spine and is not included.
IMPRESSION: 1. Chronic upper cervical spinal stenosis with spinal cord mass
effect at C3-C4 and C4-C5. Up to moderate cord compression with
chronic spinal cord myelomalacia at both levels.
2. Moderate or severe multifactorial neural foraminal stenosis at
the bilateral C4, bilateral C5, left C6, left C8, right T2, and
right T3 nerve levels.
3. Borderline to mild cervical spinal stenosis at C5-C6 and C6-C7.
No significant thoracic spinal stenosis. No thoracic spinal cord
signal abnormality.
4. No acute intracranial abnormality. Moderate for age nonspecific
cerebral white matter signal changes.
